# Patient Record
Sex: Female | Born: 1970
Health system: Southern US, Community
[De-identification: ages and names within clinical notes are randomized; demographics above are authoritative.]

## PROBLEM LIST (undated history)

## (undated) DIAGNOSIS — F419 Anxiety disorder, unspecified: Secondary | ICD-10-CM

## (undated) DIAGNOSIS — G4700 Insomnia, unspecified: Secondary | ICD-10-CM

## (undated) DIAGNOSIS — Z9289 Personal history of other medical treatment: Secondary | ICD-10-CM

## (undated) DIAGNOSIS — F32A Depression, unspecified: Secondary | ICD-10-CM

## (undated) DIAGNOSIS — F320 Major depressive disorder, single episode, mild: Secondary | ICD-10-CM

## (undated) DIAGNOSIS — F410 Panic disorder [episodic paroxysmal anxiety] without agoraphobia: Secondary | ICD-10-CM

## (undated) HISTORY — DX: Panic disorder (episodic paroxysmal anxiety): F41.0

## (undated) HISTORY — DX: Personal history of other medical treatment: Z92.89

## (undated) HISTORY — DX: Anxiety disorder, unspecified: F41.9

## (undated) HISTORY — DX: Major depressive disorder, single episode, mild: F32.0

## (undated) HISTORY — DX: Insomnia, unspecified: G47.00

## (undated) HISTORY — DX: Depression, unspecified: F32.A

---
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
## 1995-12-05 HISTORY — PX: WISDOM TOOTH EXTRACTION: SHX21

## 2001-12-04 HISTORY — PX: AUGMENTATION MAMMAPLASTY: SUR837

## 2005-09-14 ENCOUNTER — Encounter: Payer: Self-pay | Admitting: Otolaryngology

## 2005-10-04 ENCOUNTER — Encounter: Payer: Self-pay | Admitting: Otolaryngology

## 2011-12-05 HISTORY — PX: APPENDECTOMY: SHX54

## 2012-07-01 LAB — COMPREHENSIVE METABOLIC PANEL
Albumin: 3.6 g/dL (ref 3.4–5.0)
Alkaline Phosphatase: 54 U/L (ref 50–136)
Anion Gap: 10 (ref 7–16)
Calcium, Total: 8.3 mg/dL — ABNORMAL LOW (ref 8.5–10.1)
Co2: 23 mmol/L (ref 21–32)
Osmolality: 275 (ref 275–301)
Potassium: 3.5 mmol/L (ref 3.5–5.1)
SGOT(AST): 22 U/L (ref 15–37)
Sodium: 138 mmol/L (ref 136–145)

## 2012-07-01 LAB — URINALYSIS, COMPLETE
Bilirubin,UR: NEGATIVE
Blood: NEGATIVE
Ketone: NEGATIVE
Ph: 9 (ref 4.5–8.0)
RBC,UR: 9 /HPF (ref 0–5)
Squamous Epithelial: 5

## 2012-07-01 LAB — CBC
HCT: 36.4 % (ref 35.0–47.0)
MCHC: 35.4 g/dL (ref 32.0–36.0)
Platelet: 234 10*3/uL (ref 150–440)
RBC: 4.15 10*6/uL (ref 3.80–5.20)
RDW: 12.6 % (ref 11.5–14.5)

## 2012-07-01 LAB — PREGNANCY, URINE: Pregnancy Test, Urine: NEGATIVE m[IU]/mL

## 2012-07-01 LAB — LIPASE, BLOOD: Lipase: 114 U/L (ref 73–393)

## 2012-07-02 LAB — TROPONIN I: Troponin-I: <0.02 ng/mL

## 2012-07-03 ENCOUNTER — Inpatient Hospital Stay: Payer: Self-pay | Admitting: Surgery

## 2012-07-03 LAB — CBC WITH DIFFERENTIAL/PLATELET
Basophil #: 0.1 10*3/uL (ref 0.0–0.1)
Basophil %: 0.6 %
Eosinophil #: 0 10*3/uL (ref 0.0–0.7)
Eosinophil %: 0.3 %
HCT: 31.2 % — ABNORMAL LOW (ref 35.0–47.0)
HGB: 10.8 g/dL — ABNORMAL LOW (ref 12.0–16.0)
Lymphocyte %: 9.2 %
MCHC: 34.6 g/dL (ref 32.0–36.0)
Monocyte %: 6.7 %
Neutrophil #: 7.7 10*3/uL — ABNORMAL HIGH (ref 1.4–6.5)
Platelet: 172 10*3/uL (ref 150–440)
RBC: 3.49 10*6/uL — ABNORMAL LOW (ref 3.80–5.20)
WBC: 9.2 10*3/uL (ref 3.6–11.0)

## 2012-07-04 LAB — PATHOLOGY REPORT

## 2012-07-05 LAB — CBC WITH DIFFERENTIAL/PLATELET
Basophil %: 0.4 %
Eosinophil %: 2 %
HCT: 29.5 % — ABNORMAL LOW (ref 35.0–47.0)
HGB: 10.2 g/dL — ABNORMAL LOW (ref 12.0–16.0)
Lymphocyte #: 1.3 10*3/uL (ref 1.0–3.6)
Lymphocyte %: 22.2 %
MCHC: 34.7 g/dL (ref 32.0–36.0)
MCV: 89 fL (ref 80–100)
Monocyte %: 15.4 %
Neutrophil #: 3.6 10*3/uL (ref 1.4–6.5)
RBC: 3.3 10*6/uL — ABNORMAL LOW (ref 3.80–5.20)
RDW: 13 % (ref 11.5–14.5)
WBC: 5.9 10*3/uL (ref 3.6–11.0)

## 2013-01-06 DIAGNOSIS — Z9289 Personal history of other medical treatment: Secondary | ICD-10-CM

## 2013-01-06 HISTORY — DX: Personal history of other medical treatment: Z92.89

## 2015-02-01 DIAGNOSIS — Z9289 Personal history of other medical treatment: Secondary | ICD-10-CM

## 2015-02-01 HISTORY — DX: Personal history of other medical treatment: Z92.89

## 2015-03-23 NOTE — H&P (Signed)
Subjective/Chief Complaint severe abdominal pain    History of Present Illness 40 yowf with 2 days worsening abdominal pain nausea emesis, now with severe rlq abdominal pain. CT scan shows acute appendicitis    Past History none   Past Med/Surgical Hx:  Denies medical history:   Laparoscopy:   ALLERGIES:  No Known Allergies:     Other Allergies none   HOME MEDICATIONS: Medication Instructions Status  b   birth control pill daily Active   Family and Social History:   Family History Non-Contributory    Social History positive  tobacco, positive ETOH, negative Illicit drugs    + Tobacco Current (within 1 year)    Place of Living Home   Review of Systems:   Subjective/Chief Complaint see above    Abdominal Pain Yes    Nausea/Vomiting Yes    Tolerating Diet No  Nauseated  Vomiting    Medications/Allergies Reviewed Medications/Allergies reviewed   Physical Exam:   GEN thin, disheveled, critically ill appearing    HEENT pale conjunctivae    NECK supple    RESP normal resp effort  clear BS    CARD regular rate    ABD positive tenderness  positive Flank Tenderness  no hernia  distended    LYMPH negative neck    EXTR negative cyanosis/clubbing    SKIN normal to palpation    NEURO cranial nerves intact    PSYCH A+O to time, place, person   Lab Results: Hepatic:  29-Jul-13 21:01    Bilirubin, Total 0.7   Alkaline Phosphatase 54   SGPT (ALT) 34 (12-78 NOTE: NEW REFERENCE RANGE 10/27/2011)   SGOT (AST) 22   Total Protein, Serum 6.9   Albumin, Serum 3.6  Routine Chem:  29-Jul-13 21:01    Glucose, Serum  148   BUN  3   Creatinine (comp) 0.72   Sodium, Serum 138   Potassium, Serum 3.5   Chloride, Serum 105   CO2, Serum 23   Calcium (Total), Serum  8.3   Osmolality (calc) 275   eGFR (African American) >60   eGFR (Non-African American) >60 (eGFR values <47m/min/1.73 m2 may be an indication of chronic kidney disease (CKD). Calculated eGFR is  useful in patients with stable renal function. The eGFR calculation will not be reliable in acutely ill patients when serum creatinine is changing rapidly. It is not useful in  patients on dialysis. The eGFR calculation may not be applicable to patients at the low and high extremes of body sizes, pregnant women, and vegetarians.)   Anion Gap 10   Lipase 114 (Result(s) reported on 01 Jul 2012 at 09:38PM.)  Routine UA:  29-Jul-13 21:01    Color (UA) Yellow   Clarity (UA) Hazy   Glucose (UA) 50 mg/dL   Bilirubin (UA) Negative   Ketones (UA) Negative   Specific Gravity (UA) 1.011   Blood (UA) Negative   pH (UA) 9.0   Protein (UA) Negative   Nitrite (UA) Negative   Leukocyte Esterase (UA) Negative (Result(s) reported on 01 Jul 2012 at 09:40PM.)   RBC (UA) 9 /HPF   WBC (UA) 1 /HPF   Bacteria (UA) 1+   Epithelial Cells (UA) 5 /HPF   Mucous (UA) PRESENT (Result(s) reported on 01 Jul 2012 at 09:40PM.)  Routine Sero:  29-Jul-13 21:01    Pregnancy Test, Urine NEGATIVE (The results of the qualitative urine HCG (Pregnancy Test) should be evaluated in light of other clinical information.  There are limitations to the  test which, in certain clinical situations, may result in a false positive or negative result. Thehigh dose hook effect can occur in urine samples with extremely high HCG concentrations.  This effect can produce a negative result in certain situations. It is suggested that results of the qualitative HCG be confirmed by an alternate methodology, such as the quantitative serum beta HCG test.)  Routine Hem:  29-Jul-13 21:01    WBC (CBC) 8.4   RBC (CBC) 4.15   Hemoglobin (CBC) 12.9   Hematocrit (CBC) 36.4   Platelet Count (CBC) 234 (Result(s) reported on 01 Jul 2012 at 09:28PM.)   MCV 88   MCH 31.0   MCHC 35.4   RDW 12.6   Radiology Results: CT:    30-Jul-13 06:41, CT Abdomen and Pelvis With Contrast   CT Abdomen and Pelvis With Contrast   REASON FOR EXAM:    (1) 24  hrs of abd pain/pressure; (2) abd pain  COMMENTS:       PROCEDURE: CT  - CT ABDOMEN / PELVIS  W  - Jul 02 2012  6:41AM     RESULT: History: Pain.    Findings: Standard CT obtained the 100 cc of Isovue-300. Bilateral breast   implants noted. Lung bases clear. No free air. Liver normal. Fatty   infiltration adjacent to the a pulse for malignant noted. Gallbladder   nondistended. Pancreas normal. Spleen normal. Adrenals normal. No focal   renal abnormalities noted. Aorta nondistended. The appendix is dilated to   1.2 cm and contains an appendicolith. These finds are consistent with   appendicitis. Free fluid noted in the pelvis. Ruptured appendix cannot be   excluded.  IMPRESSION:  Findings consistent with appendicitiswith possible rupture.          Verified By: Osa Craver, M.D., MD     Assessment/Admission Diagnosis 68 yowf with acute appendicitis    Plan npo, abx, lap appendectomy, discussed procedure with her and family and all questions addressed. consent personally obtained. time spent 30 minutes   Electronic Signatures: Sherri Rad (MD)  (Signed 30-Jul-13 08:36)  Authored: CHIEF COMPLAINT and HISTORY, PAST MEDICAL/SURGIAL HISTORY, ALLERGIES, Other Allergies, HOME MEDICATIONS, FAMILY AND SOCIAL HISTORY, REVIEW OF SYSTEMS, PHYSICAL EXAM, LABS, Radiology, ASSESSMENT AND PLAN   Last Updated: 30-Jul-13 08:36 by Sherri Rad (MD)

## 2015-03-23 NOTE — Op Note (Signed)
PATIENT NAME:  Tammy Wilkins, Tammy Wilkins MR#:  [id number] DATE OF BIRTH:  10-18-1971  DATE OF PROCEDURE:  07/02/2012  PREOPERATIVE DIAGNOSIS: Acute appendicitis.   POSTOPERATIVE DIAGNOSIS: Acute gangrenous appendicitis.   PROCEDURE PERFORMED: Laparoscopic appendectomy.   SURGEON: Hortencia Conradi, MD   ASSISTANT: None.   ANESTHESIA: General endotracheal.   FINDINGS: Acute gangrenous appendicitis. Normal gallbladder and liver.   ESTIMATED BLOOD LOSS: 25 mL.   DRAINS: None.   COUNTS: LAP and needle counts correct x2.   DESCRIPTION OF PROCEDURE: With informed consent, supine position, general endotracheal anesthesia, the left arm padded and tucked at the side, the abdomen was sterilely prepped and draped with ChloraPrep solution. Timeout was observed. A 12 mm blunt Hassan trocar was placed through an open technique through an infraumbilical transverse skin incision. Pneumoperitoneum was established. A 12 mm Bladeless trocar was placed in the right upper quadrant, a 5 mm Bladeless trocar in the left lower quadrant. The appendix was identified, gangrenous, and photodocumentation was obtained. It was elevated towards the anterior abdominal wall. The mesoappendix was taken with two fires of the endoscopic 35 mm stapler with white load application. The base of the appendix was then clearly identified, and a single fire of the blue load of the stapler was utilized. The specimen was captured in an Endo Catch device and retrieved. The right lower quadrant was irrigated with a total of 2 liters of saline. During the process, a small amount of purulent material was spilled from inside the appendix, but no signs of diffuse peritonitis were identified at the time of laparoscopy.   Hemostasis was obtained with point cautery along the mesoappendix staple line, followed by application of Surgiflo with thrombin application. Ports were then removed under direct visualization. The right upper quadrant port site was closed  with a Endo Stitch of #0 Vicryl suture. The infraumbilical fascial defect was closed with an additional figure-of-eight #0 Vicryl suture and the existing stay sutures tied to each other. A total of 30 mL of 0.25% plain Marcaine was infiltrated along all skin and fascial incisions prior to closure. Then 4-0 Vicryl was used to reapproximate skin edges, followed by benzoin, Steri-Strips, Telfa, and Tegaderm. The patient was then subsequently extubated and taken to the recovery room in stable and satisfactory condition by Anesthesia services.    ____________________________ Jeannette How Marina Gravel, MD mab:cbb D: 07/02/2012 10:54:22 ET T: 07/02/2012 12:41:01 ET JOB#: [id number]  cc: Elta Guadeloupe A. Marina Gravel, MD, <Dictator> Rosendo Couser A Mikhi Athey MD ELECTRONICALLY SIGNED 07/09/2012 9:18

## 2015-03-23 NOTE — Discharge Summary (Signed)
PATIENT NAME:  Tammy Wilkins, Tammy Wilkins MR#:  [id number] DATE OF BIRTH:  September 05, 1971  DATE OF ADMISSION:  07/03/2012 DATE OF DISCHARGE:  07/08/2012  ATTENDING PHYSICIAN: Sherri Rad, MD   DISCHARGING PHYSICIAN: Tammy Norfolk. Aaryn Parrilla, MD   DISCHARGE DIAGNOSIS: Gangrenous appendicitis.   PROCEDURES PERFORMED: Laparoscopic appendectomy  DISCHARGE MEDICATIONS:  1. Birth control pill qdaily.  2. Clindamycin 300 mg p.o. t.i.d. x4 days. 3. Keflex 500 mg p.o. t.i.d. x4 days. 4. Tylox 500 mg, 1 to 2 tabs p.o. every 4 hours p.r.n.  pain.  INDICATION FOR ADMISSION/HOSPITAL COURSE:  [name] is a pleasant 44 year old female who was admitted for right lower quadrant pain.  Her workup showed that she had acute appendicitis.  She underwent laparoscopic appendectomy which proved to be gangrenous.  Postoperatively, she had pain control issues and required further intravenous antibiotics as there was a small amount of intraabdominal contamination.  Her diet was slowly advanced and she was converted to PO pain medications and antibiotics.  At the time of discharge she was having good p.o. pain control, was voiding and stooling without difficulty.  Pain was controlled, and she was on day 6 of 10 for antibiotics.    DISCHARGE INSTRUCTIONS:  1. The patient is to not lift greater than 10 pounds for approx 6 weeks postoperatively.  2. She is not to drive on pain medications.  3. She is to take p.o. pain medications as needed for pain. 4.  She is to call or return to clinic if she has increased pain, fevers greater than 101.5, nausea/vomiting, redness/drainage from incision. 5. She is to follow up with  in 7 to 10 days. 6. She is to be excused from work until followup.  ____________________________ Tammy Norfolk. Fayth Trefry, MD cal:cbb D: 07/08/2012 11:56:11 ET T: 07/08/2012 12:21:44 ET JOB#: [id number] cc: Harrell Gave A. Renea Schoonmaker, MD, <Dictator> Floyde Parkins MD ELECTRONICALLY SIGNED 07/08/2012 14:37

## 2017-07-09 ENCOUNTER — Encounter: Payer: Self-pay | Admitting: Obstetrics and Gynecology

## 2017-07-09 ENCOUNTER — Ambulatory Visit (INDEPENDENT_AMBULATORY_CARE_PROVIDER_SITE_OTHER): Payer: Managed Care, Other (non HMO) | Admitting: Obstetrics and Gynecology

## 2017-07-09 VITALS — BP 98/62 | HR 87 | Ht 62.0 in | Wt 139.0 lb

## 2017-07-09 DIAGNOSIS — T192XXA Foreign body in vulva and vagina, initial encounter: Secondary | ICD-10-CM | POA: Diagnosis not present

## 2017-07-09 NOTE — Progress Notes (Signed)
     Obstetrics & Gynecology Office Visit   Chief Complaint:  Chief Complaint  Patient presents with  . Gynecologic Exam    retained tampon x1 day    History of Present Illness: 45 year old female presenting for evaluation of possible retained tampon.  She reports LMP last week, recalls placing tampon yesterday but when she went to remove it she was unable to find it.  She has not noted any foul smelling vaginal discharge, fevers, or chills.  She has been unable to feel a tampon string or tampon material.  Does not have a prior history of problems with retained tampons.  Still having some light bleeding.     Review of Systems: 10 point review of systems negative unless otherwise noted in HPI  Past Medical History:  Past Medical History:  Diagnosis Date  . Anxiety     Past Surgical History:  Past Surgical History:  Procedure Laterality Date  . APPENDECTOMY  2013    Gynecologic History: Patient's last menstrual period was 07/04/2017 (exact date).  Obstetric History: [id number]  Family History:  History reviewed. No pertinent family history.  Social History:  Social History   Social History  . Marital status: Married    Spouse name: N/A  . Number of children: N/A  . Years of education: N/A   Occupational History  . Not on file.   Social History Main Topics  . Smoking status: Never Smoker  . Smokeless tobacco: Never Used  . Alcohol use Yes  . Drug use: No  . Sexual activity: Yes    Partners: Male    Birth control/ protection: None   Other Topics Concern  . Not on file   Social History Narrative  . No narrative on file    Allergies:  No Known Allergies  Medications: Prior to Admission medications   Medication Sig Start Date End Date Taking? Authorizing Provider  ALPRAZolam Duanne Moron) 0.25 MG tablet Take 0.25 mg by mouth at bedtime as needed for anxiety.   Yes Copland, Deirdre Evener, PA-C    Physical Exam Vitals:  Vitals:   07/09/17 0859  BP: 98/62  Pulse:  87   Patient's last menstrual period was 07/04/2017 (exact date).  General: NAD HEENT: normocephalic, anicteric Pulmonary: No increased work of breathing  Genitourinary:  External: Normal external female genitalia.  Normal urethral meatus, normal  Bartholin's and Skene's glands.    Vagina: Normal vaginal mucosa, no evidence of prolapse.    Cervix: Grossly normal in appearance, no bleeding  Uterus: Non-enlarged, mobile, normal contour.  No CMT  Adnexa: ovaries non-enlarged, no adnexal masses  Rectal: deferred  Lymphatic: no evidence of inguinal lymphadenopathy Extremities: no edema, erythema, or tenderness Neurologic: Grossly intact Psychiatric: mood appropriate, affect full  Female chaperone present for pelvic and breast  portions of the physical exam  Assessment: 46 y.o. [id number] presenting for evaluation of retained tampon, no evidence of retained tampon on exam  Plan: Problem List Items Addressed This Visit    None    Visit Diagnoses    Retained tampon, initial encounter    -  Primary     - No evidence of retained tampon - Discussed concerns with retained tampon and toxic shock syndrome - A total of 15 minutes were spent in face-to-face contact with the patient during this encounter with over half of that time devoted to counseling and coordination of care.

## 2017-08-29 ENCOUNTER — Ambulatory Visit (INDEPENDENT_AMBULATORY_CARE_PROVIDER_SITE_OTHER): Payer: Managed Care, Other (non HMO) | Admitting: Obstetrics and Gynecology

## 2017-08-29 ENCOUNTER — Encounter: Payer: Self-pay | Admitting: Obstetrics and Gynecology

## 2017-08-29 VITALS — BP 110/80 | HR 78 | Ht 62.0 in | Wt 141.0 lb

## 2017-08-29 DIAGNOSIS — Z1231 Encounter for screening mammogram for malignant neoplasm of breast: Secondary | ICD-10-CM | POA: Diagnosis not present

## 2017-08-29 DIAGNOSIS — Z01419 Encounter for gynecological examination (general) (routine) without abnormal findings: Secondary | ICD-10-CM | POA: Diagnosis not present

## 2017-08-29 DIAGNOSIS — Z30011 Encounter for initial prescription of contraceptive pills: Secondary | ICD-10-CM

## 2017-08-29 DIAGNOSIS — Z1239 Encounter for other screening for malignant neoplasm of breast: Secondary | ICD-10-CM

## 2017-08-29 DIAGNOSIS — F419 Anxiety disorder, unspecified: Secondary | ICD-10-CM

## 2017-08-29 MED ORDER — NORETHIN-ETH ESTRAD-FE BIPHAS 1 MG-10 MCG / 10 MCG PO TABS: 1.0000 | ORAL_TABLET | Freq: Every day | ORAL | 3 refills | Status: DC

## 2017-08-29 MED ORDER — ALPRAZOLAM 0.25 MG PO TABS: 0.2500 mg | ORAL_TABLET | Freq: Every evening | ORAL | 0 refills | Status: DC | PRN

## 2017-08-29 NOTE — Progress Notes (Signed)
PCP:  Patient, No Pcp Per   Chief Complaint  Patient presents with  . Gynecologic Exam    See blood work from work; restart bc     HPI:      [name] is a 46 y.o. [id number] who LMP was Patient's last menstrual period was 08/08/2017., presents today for her annual examination.  Her menses are regular every 28-30 days, lasting 7 days, med heavy flow.  Dysmenorrhea mild, occurring first 1-2 days of flow. She does not have intermenstrual bleeding. Pt interested in OCPs again for cycle improvement. No hx of migraines with aura/tobacco use/HTN. She did OCPs in the past without any side effects.  Sex activity: single partner, contraception - none. Conception ok but pt wants BC now for cycles. Last Pap: February 11, 2016  Results were: no abnormalities /neg HPV DNA  Hx of STDs: none  Last mammogram: February 11, 2016  Results were: normal--routine follow-up in 12 months There is no FH of breast cancer. There is no FH of ovarian cancer. The patient does do self-breast exams.  Tobacco use: The patient denies current or previous tobacco use. Alcohol use: social drinker No drug use.  Exercise: moderately active  She does get adequate calcium and Vitamin D in her diet.  She needs Rx RF xanax to take sparingly for anxiety.   Past Medical History:  Diagnosis Date  . Anxiety     Past Surgical History:  Procedure Laterality Date  . APPENDECTOMY  2013  . EYE SURGERY  1974  . WISDOM TOOTH EXTRACTION  1997   all four    Family History  Problem Relation Age of Onset  . Heart attack Father 7       passed away  . Diabetes Maternal Grandmother   . Cancer Maternal Grandfather 65       lung  . Diabetes Maternal Grandfather   . Diabetes Paternal Grandmother   . Diabetes Paternal Grandfather     Social History   Social History  . Marital status: Married    Spouse name: N/A  . Number of children: N/A  . Years of education: N/A   Occupational History  . Not on file.    Social History Main Topics  . Smoking status: Never Smoker  . Smokeless tobacco: Never Used  . Alcohol use Yes  . Drug use: No  . Sexual activity: Yes    Partners: Male    Birth control/ protection: None   Other Topics Concern  . Not on file   Social History Narrative  . No narrative on file    Current Meds  Medication Sig  . ALPRAZolam (XANAX) 0.25 MG tablet Take 1 tablet (0.25 mg total) by mouth at bedtime as needed for anxiety.  . [DISCONTINUED] ALPRAZolam (XANAX) 0.25 MG tablet Take 0.25 mg by mouth at bedtime as needed for anxiety.     ROS:  Review of Systems  Constitutional: Negative for fatigue, fever and unexpected weight change.  Respiratory: Negative for cough, shortness of breath and wheezing.   Cardiovascular: Negative for chest pain, palpitations and leg swelling.  Gastrointestinal: Negative for blood in stool, constipation, diarrhea, nausea and vomiting.  Endocrine: Negative for cold intolerance, heat intolerance and polyuria.  Genitourinary: Negative for dyspareunia, dysuria, flank pain, frequency, genital sores, hematuria, menstrual problem, pelvic pain, urgency, vaginal bleeding, vaginal discharge and vaginal pain.  Musculoskeletal: Negative for back pain, joint swelling and myalgias.  Skin: Negative for rash.  Neurological: Negative for dizziness, syncope,  light-headedness, numbness and headaches.  Hematological: Negative for adenopathy.  Psychiatric/Behavioral: Negative for agitation, confusion, sleep disturbance and suicidal ideas. The patient is not nervous/anxious.      Objective: BP 110/80   Pulse 78   Ht 5' 2"  (1.575 m)   Wt 141 lb (64 kg)   LMP 08/08/2017   BMI 25.79 kg/m    Physical Exam  Constitutional: She is oriented to person, place, and time. She appears well-developed and well-nourished.  Genitourinary: Vagina normal and uterus normal. There is no rash or tenderness on the right labia. There is no rash or tenderness on the left  labia. No erythema or tenderness in the vagina. No vaginal discharge found. Right adnexum does not display mass and does not display tenderness. Left adnexum does not display mass and does not display tenderness. Cervix does not exhibit motion tenderness or polyp. Uterus is not enlarged or tender.  Neck: Normal range of motion. No thyromegaly present.  Cardiovascular: Normal rate, regular rhythm and normal heart sounds.   No murmur heard. Pulmonary/Chest: Effort normal and breath sounds normal. Right breast exhibits no mass, no nipple discharge, no skin change and no tenderness. Left breast exhibits no mass, no nipple discharge, no skin change and no tenderness.  Abdominal: Soft. There is no tenderness. There is no guarding.  Musculoskeletal: Normal range of motion.  Neurological: She is alert and oriented to person, place, and time. No cranial nerve deficit.  Psychiatric: She has a normal mood and affect. Her behavior is normal.  Vitals reviewed.   Assessment/Plan: Encounter for annual routine gynecological examination  Screening for breast cancer - Pt to sched mammo. - Plan: MM DIGITAL SCREENING BILATERAL  Encounter for initial prescription of contraceptive pills - OCP start with next menses. Rx Lo Lo. 1 sample/coupon card. Condoms for 1 wk. - Plan: Norethindrone-Ethinyl Estradiol-Fe Biphas (LO LOESTRIN FE) 1 MG-10 MCG / 10 MCG tablet  Anxiety - Rx RF xanax to take sparingly - Plan: ALPRAZolam (XANAX) 0.25 MG tablet  Meds ordered this encounter  Medications  . Norethindrone-Ethinyl Estradiol-Fe Biphas (LO LOESTRIN FE) 1 MG-10 MCG / 10 MCG tablet    Sig: Take 1 tablet by mouth daily.    Dispense:  84 tablet    Refill:  3  . ALPRAZolam (XANAX) 0.25 MG tablet    Sig: Take 1 tablet (0.25 mg total) by mouth at bedtime as needed for anxiety.    Dispense:  30 tablet    Refill:  0             GYN counsel mammography screening, use and side effects of OCP's, adequate intake of calcium and  vitamin D, diet and exercise     F/U  Return in about 1 year (around 08/29/2018).  Alicia B. Copland, PA-C 08/29/2017 9:30 AM

## 2017-11-30 ENCOUNTER — Other Ambulatory Visit: Payer: Self-pay | Admitting: General Surgery

## 2017-11-30 ENCOUNTER — Other Ambulatory Visit: Payer: Self-pay | Admitting: Neurosurgery

## 2017-11-30 DIAGNOSIS — R110 Nausea: Secondary | ICD-10-CM

## 2017-11-30 DIAGNOSIS — R1011 Right upper quadrant pain: Secondary | ICD-10-CM

## 2017-12-03 ENCOUNTER — Other Ambulatory Visit: Payer: Self-pay | Admitting: General Surgery

## 2017-12-03 ENCOUNTER — Ambulatory Visit
Admission: RE | Admit: 2017-12-03 | Discharge: 2017-12-03 | Disposition: A | Discharge status: Home / Self Care | Payer: Managed Care, Other (non HMO) | Source: Ambulatory Visit | Attending: General Surgery | Admitting: General Surgery

## 2017-12-03 DIAGNOSIS — R1011 Right upper quadrant pain: Secondary | ICD-10-CM | POA: Diagnosis not present

## 2017-12-03 DIAGNOSIS — R110 Nausea: Secondary | ICD-10-CM | POA: Diagnosis present

## 2017-12-17 ENCOUNTER — Other Ambulatory Visit: Payer: Self-pay | Admitting: Obstetrics and Gynecology

## 2017-12-17 DIAGNOSIS — F419 Anxiety disorder, unspecified: Secondary | ICD-10-CM

## 2018-01-01 ENCOUNTER — Encounter: Payer: Self-pay | Admitting: Internal Medicine

## 2018-01-01 ENCOUNTER — Ambulatory Visit: Payer: Managed Care, Other (non HMO) | Admitting: Internal Medicine

## 2018-01-01 VITALS — BP 120/71 | HR 79 | Resp 16 | Ht 62.0 in | Wt 140.6 lb

## 2018-01-01 DIAGNOSIS — K819 Cholecystitis, unspecified: Secondary | ICD-10-CM | POA: Insufficient documentation

## 2018-01-01 DIAGNOSIS — F419 Anxiety disorder, unspecified: Secondary | ICD-10-CM | POA: Diagnosis not present

## 2018-01-01 DIAGNOSIS — Z1231 Encounter for screening mammogram for malignant neoplasm of breast: Secondary | ICD-10-CM

## 2018-01-01 DIAGNOSIS — R109 Unspecified abdominal pain: Secondary | ICD-10-CM

## 2018-01-01 NOTE — Progress Notes (Signed)
[facility] [place], [place] [address]  Internal MEDICINE  Office Visit Note  Patient Name: Tammy Wilkins  [id number]  [id number]  Date of Service: 01/01/2018   Complaints/HPI Pt is here for establishment of PCP. Abdominal pain and bloating Went to walk in and had u/s done Wbc was elevated. Took 2 different antibiotics Still gets her cycle. She is pain free after antibiotics    Current Medication: Outpatient Encounter Medications as of 01/01/2018  Medication Sig  . ALPRAZolam (XANAX) 0.25 MG tablet TAKE 1 TABLET BY MOUTH EVERY NIGHT AT BEDTIME AS NEEDED FOR ANXIETY  . Norethindrone-Ethinyl Estradiol-Fe Biphas (LO LOESTRIN FE) 1 MG-10 MCG / 10 MCG tablet Take 1 tablet by mouth daily.   No facility-administered encounter medications on file as of 01/01/2018.     Surgical History: Past Surgical History:  Procedure Laterality Date  . APPENDECTOMY  2013  . EYE SURGERY  1974  . WISDOM TOOTH EXTRACTION  1997   all four    Medical History: Past Medical History:  Diagnosis Date  . Anxiety     Family History: Family History  Problem Relation Age of Onset  . Heart attack Father 2       passed away  . Diabetes Maternal Grandmother   . Cancer Maternal Grandfather 65       lung  . Diabetes Maternal Grandfather   . Diabetes Paternal Grandmother   . Diabetes Paternal Grandfather     Social History   Socioeconomic History  . Marital status: Married    Spouse name: Not on file  . Number of children: Not on file  . Years of education: Not on file  . Highest education level: Not on file  Social Needs  . Financial resource strain: Not on file  . Food insecurity - worry: Not on file  . Food insecurity - inability: Not on file  . Transportation needs - medical: Not on file  . Transportation needs - non-medical: Not on file  Occupational History  . Not on file  Tobacco Use  . Smoking status: Never Smoker  . Smokeless tobacco: Never Used   Substance and Sexual Activity  . Alcohol use: Yes  . Drug use: No  . Sexual activity: Yes    Partners: Male    Birth control/protection: None  Other Topics Concern  . Not on file  Social History Narrative  . Not on file     Review of Systems  Constitutional: Negative for chills, fatigue and unexpected weight change.  HENT: Positive for postnasal drip. Negative for congestion, rhinorrhea, sneezing and sore throat.   Eyes: Negative for redness.  Respiratory: Negative for cough, chest tightness and shortness of breath.   Cardiovascular: Negative for chest pain and palpitations.  Gastrointestinal: Negative for abdominal pain, constipation, diarrhea, nausea and vomiting.  Genitourinary: Negative for dysuria and frequency.  Musculoskeletal: Negative for arthralgias, back pain, joint swelling and neck pain.  Skin: Negative for rash.  Neurological: Negative.  Negative for tremors and numbness.  Hematological: Negative for adenopathy. Does not bruise/bleed easily.  Psychiatric/Behavioral: Negative for behavioral problems (Depression), sleep disturbance and suicidal ideas. The patient is not nervous/anxious.     Vital Signs: BP 120/71 (BP Location: Left Arm, Patient Position: Sitting)   Pulse 79   Resp 16   Ht 5' 2"  (1.575 m)   Wt 140 lb 9.6 oz (63.8 kg)   SpO2 99%   BMI 25.72 kg/m    Physical Exam  Constitutional: She  is oriented to person, place, and time. She appears well-developed and well-nourished. No distress.  HENT:  Head: Normocephalic and atraumatic.  Mouth/Throat: Oropharynx is clear and moist. No oropharyngeal exudate.  Eyes: EOM are normal. Pupils are equal, round, and reactive to light.  Neck: Normal range of motion. Neck supple. No JVD present. No tracheal deviation present. No thyromegaly present.  Cardiovascular: Normal rate, regular rhythm and normal heart sounds. Exam reveals no gallop and no friction rub.  No murmur heard. Pulmonary/Chest: Effort normal. No  respiratory distress. She has no wheezes. She has no rales. She exhibits no tenderness.  Abdominal: Soft. Bowel sounds are normal.  Musculoskeletal: Normal range of motion.  Lymphadenopathy:    She has no cervical adenopathy.  Neurological: She is alert and oriented to person, place, and time. No cranial nerve deficit.  Skin: Skin is warm and dry. She is not diaphoretic.  Psychiatric: She has a normal mood and affect. Her behavior is normal. Judgment and thought content normal.    Assessment/Plan: 1. Abdominal pain, unspecified abdominal location - CBC With Differential - Sed Rate (ESR)  2. Anxiety - Controlled.  3. Breast cancer screening by mammogram - MM DIGITAL SCREENING BILATERAL; Future  General Counseling: Tammy Wilkins understanding of the findings of todays visit and agrees with plan of treatment. I have discussed any further diagnostic evaluation that may be needed or ordered today. We also reviewed her medications today. she has been encouraged to call the office with any questions or concerns that should arise related to todays visit.    Time spent: 30 Minutes

## 2018-01-24 ENCOUNTER — Other Ambulatory Visit: Payer: Self-pay | Admitting: Internal Medicine

## 2018-01-24 ENCOUNTER — Ambulatory Visit
Admission: RE | Admit: 2018-01-24 | Discharge: 2018-01-24 | Disposition: A | Discharge status: Home / Self Care | Payer: Managed Care, Other (non HMO) | Source: Ambulatory Visit | Attending: Internal Medicine | Admitting: Internal Medicine

## 2018-01-24 DIAGNOSIS — Z1231 Encounter for screening mammogram for malignant neoplasm of breast: Secondary | ICD-10-CM | POA: Insufficient documentation

## 2018-01-25 LAB — CBC WITH DIFFERENTIAL
Basophils Absolute: 0 10*3/uL (ref 0.0–0.2)
Basos: 0 %
EOS (ABSOLUTE): 0 10*3/uL (ref 0.0–0.4)
EOS: 1 %
HEMATOCRIT: 38.2 % (ref 34.0–46.6)
HEMOGLOBIN: 13 g/dL (ref 11.1–15.9)
IMMATURE GRANULOCYTES: 1 %
Immature Grans (Abs): 0.1 10*3/uL (ref 0.0–0.1)
LYMPHS ABS: 1.5 10*3/uL (ref 0.7–3.1)
Lymphs: 28 %
MCH: 29.6 pg (ref 26.6–33.0)
MCHC: 34 g/dL (ref 31.5–35.7)
MCV: 87 fL (ref 79–97)
MONOS ABS: 0.7 10*3/uL (ref 0.1–0.9)
Monocytes: 13 %
Neutrophils Absolute: 3 10*3/uL (ref 1.4–7.0)
Neutrophils: 57 %
RBC: 4.39 x10E6/uL (ref 3.77–5.28)
RDW: 13.9 % (ref 12.3–15.4)
WBC: 5.3 10*3/uL (ref 3.4–10.8)

## 2018-01-25 LAB — SEDIMENTATION RATE: Sed Rate: 5 mm/hr (ref 0–32)

## 2018-03-26 ENCOUNTER — Encounter: Payer: Self-pay | Admitting: Obstetrics and Gynecology

## 2018-03-26 ENCOUNTER — Other Ambulatory Visit: Payer: Self-pay | Admitting: Obstetrics and Gynecology

## 2018-03-26 MED ORDER — NORETHINDRONE ACET-ETHINYL EST 1-20 MG-MCG PO TABS: 1.0000 | ORAL_TABLET | Freq: Every day | ORAL | 1 refills | Status: DC

## 2018-03-26 NOTE — Progress Notes (Signed)
Rx change due to ins and now needs mail order.

## 2018-07-17 ENCOUNTER — Other Ambulatory Visit: Payer: Self-pay | Admitting: Obstetrics and Gynecology

## 2018-07-17 DIAGNOSIS — F419 Anxiety disorder, unspecified: Secondary | ICD-10-CM

## 2018-07-17 NOTE — Telephone Encounter (Signed)
Please advise. Thank you

## 2018-09-04 ENCOUNTER — Encounter: Payer: Self-pay | Admitting: Obstetrics and Gynecology

## 2018-09-04 ENCOUNTER — Ambulatory Visit (INDEPENDENT_AMBULATORY_CARE_PROVIDER_SITE_OTHER): Payer: PRIVATE HEALTH INSURANCE | Admitting: Obstetrics and Gynecology

## 2018-09-04 VITALS — BP 100/60 | HR 78 | Ht 62.0 in | Wt 146.0 lb

## 2018-09-04 DIAGNOSIS — F419 Anxiety disorder, unspecified: Secondary | ICD-10-CM

## 2018-09-04 DIAGNOSIS — R61 Generalized hyperhidrosis: Secondary | ICD-10-CM

## 2018-09-04 DIAGNOSIS — Z01411 Encounter for gynecological examination (general) (routine) with abnormal findings: Secondary | ICD-10-CM

## 2018-09-04 DIAGNOSIS — Z01419 Encounter for gynecological examination (general) (routine) without abnormal findings: Secondary | ICD-10-CM

## 2018-09-04 DIAGNOSIS — Z3041 Encounter for surveillance of contraceptive pills: Secondary | ICD-10-CM | POA: Diagnosis not present

## 2018-09-04 DIAGNOSIS — R635 Abnormal weight gain: Secondary | ICD-10-CM

## 2018-09-04 DIAGNOSIS — Z1239 Encounter for other screening for malignant neoplasm of breast: Secondary | ICD-10-CM | POA: Diagnosis not present

## 2018-09-04 MED ORDER — NORETHINDRONE ACET-ETHINYL EST 1-20 MG-MCG PO TABS
1.0000 | ORAL_TABLET | Freq: Every day | ORAL | 3 refills | Status: DC
Start: 2018-09-04 — End: 2019-09-10

## 2018-09-04 NOTE — Progress Notes (Signed)
PCP:  Lavera Guise, MD   Chief Complaint  Patient presents with  . Gynecologic Exam    pt thinks she is premonopausal, sweating middle night, has gained weight this year as well, and last period 5/3     HPI:      [name] is a 47 y.o. [id number] who LMP was Patient's last menstrual period was 04/05/2018 (exact date)., presents today for her annual examination.  Her menses were regular every 28-30 days, lasting 4 days, light to med flow. No menses since 5/19, however, on OCPs.  Dysmenorrhea mild, occurring first 1-2 days of flow. She does not have intermenstrual bleeding. Started OCPs last yr for heavier flow with good sx relief.  Wants to continue. Having night sweats and wt gain (5# in our office from last yr) this yr. Hasn't had recent thyroid check. Exercises regularly. Normal CBC 2/19.  Sex activity: single partner, contraception - OCPs Last Pap: February 11, 2016  Results were: no abnormalities /neg HPV DNA  Hx of STDs: none  Last mammogram: 01/24/18  Results were: normal--routine follow-up in 12 months There is no FH of breast cancer. There is no FH of ovarian cancer. The patient does do self-breast exams.  Tobacco use: The patient denies current or previous tobacco use. Alcohol use: social drinker No drug use.  Exercise: moderately active  She does get adequate calcium and Vitamin D in her diet.  She takes xanax sparingly for anxiety. Doesn't need RF today.  Past Medical History:  Diagnosis Date  . Anxiety   . History of mammogram 02/01/2015   birad 1  . History of Papanicolaou smear of cervix 01/06/2013   neg/neg  . Insomnia   . Mild depression ([place])   . Panic attacks     Past Surgical History:  Procedure Laterality Date  . APPENDECTOMY  2013  . AUGMENTATION MAMMAPLASTY Bilateral 2003  . EYE SURGERY  1974  . WISDOM TOOTH EXTRACTION  1997   all four    Family History  Problem Relation Age of Onset  . Heart attack Father 56       passed away  .  Diabetes Father   . Lung disease Father        TB  . Diabetes Maternal Grandmother   . Cancer Maternal Grandfather 65       lung  . Diabetes Maternal Grandfather   . Diabetes Paternal Grandmother   . Diabetes Paternal Grandfather   . Breast cancer Neg Hx     Social History   Socioeconomic History  . Marital status: Married    Spouse name: Not on file  . Number of children: Not on file  . Years of education: Not on file  . Highest education level: Not on file  Occupational History  . Not on file  Social Needs  . Financial resource strain: Not on file  . Food insecurity:    Worry: Not on file    Inability: Not on file  . Transportation needs:    Medical: Not on file    Non-medical: Not on file  Tobacco Use  . Smoking status: Never Smoker  . Smokeless tobacco: Never Used  Substance and Sexual Activity  . Alcohol use: Yes    Comment: socially  . Drug use: No  . Sexual activity: Yes    Partners: Male    Birth control/protection: Pill  Lifestyle  . Physical activity:    Days per week: Not on file  Minutes per session: Not on file  . Stress: Not on file  Relationships  . Social connections:    Talks on phone: Not on file    Gets together: Not on file    Attends religious service: Not on file    Active member of club or organization: Not on file    Attends meetings of clubs or organizations: Not on file    Relationship status: Not on file  . Intimate partner violence:    Fear of current or ex partner: Not on file    Emotionally abused: Not on file    Physically abused: Not on file    Forced sexual activity: Not on file  Other Topics Concern  . Not on file  Social History Narrative  . Not on file    Current Meds  Medication Sig  . ALPRAZolam (XANAX) 0.25 MG tablet TAKE 1 TABLET BY MOUTH EVERY NIGHT AT BEDTIME AS NEEDED FOR ANXIETY  . norethindrone-ethinyl estradiol (MICROGESTIN) 1-20 MG-MCG tablet Take 1 tablet by mouth daily.  . [DISCONTINUED]  norethindrone-ethinyl estradiol (MICROGESTIN) 1-20 MG-MCG tablet Take 1 tablet by mouth daily.     ROS:  Review of Systems  Constitutional: Positive for unexpected weight change. Negative for fatigue and fever.  Respiratory: Negative for cough, shortness of breath and wheezing.   Cardiovascular: Negative for chest pain, palpitations and leg swelling.  Gastrointestinal: Negative for blood in stool, constipation, diarrhea, nausea and vomiting.  Endocrine: Negative for cold intolerance, heat intolerance and polyuria.  Genitourinary: Negative for dyspareunia, dysuria, flank pain, frequency, genital sores, hematuria, menstrual problem, pelvic pain, urgency, vaginal bleeding, vaginal discharge and vaginal pain.  Musculoskeletal: Negative for back pain, joint swelling and myalgias.  Skin: Negative for rash.  Neurological: Negative for dizziness, syncope, light-headedness, numbness and headaches.  Hematological: Negative for adenopathy.  Psychiatric/Behavioral: Negative for agitation, confusion, sleep disturbance and suicidal ideas. The patient is not nervous/anxious.      Objective: BP 100/60   Pulse 78   Ht 5' 2"  (1.575 m)   Wt 146 lb (66.2 kg)   LMP 04/05/2018 (Exact Date)   BMI 26.70 kg/m    Physical Exam  Constitutional: She is oriented to person, place, and time. She appears well-developed and well-nourished.  Genitourinary: Vagina normal and uterus normal. There is no rash or tenderness on the right labia. There is no rash or tenderness on the left labia. No erythema or tenderness in the vagina. No vaginal discharge found. Right adnexum does not display mass and does not display tenderness. Left adnexum does not display mass and does not display tenderness. Cervix does not exhibit motion tenderness or polyp. Uterus is not enlarged or tender.  Neck: Normal range of motion. No thyromegaly present.  Cardiovascular: Normal rate, regular rhythm and normal heart sounds.  No murmur  heard. Pulmonary/Chest: Effort normal and breath sounds normal. Right breast exhibits no mass, no nipple discharge, no skin change and no tenderness. Left breast exhibits no mass, no nipple discharge, no skin change and no tenderness.  Abdominal: Soft. There is no tenderness. There is no guarding.  Musculoskeletal: Normal range of motion.  Neurological: She is alert and oriented to person, place, and time. No cranial nerve deficit.  Psychiatric: She has a normal mood and affect. Her behavior is normal.  Vitals reviewed.   Assessment/Plan: Encounter for annual routine gynecological examination  Screening for breast cancer - Pt to sched mammo for 2/20 - Plan: MM 3D SCREEN BREAST BILATERAL  Encounter for surveillance of  contraceptive pills - OCP RF.  - Plan: norethindrone-ethinyl estradiol (MICROGESTIN) 1-20 MG-MCG tablet  Anxiety - Will call for xanax RF prn  Night sweats - Check labs. WIll call wiht results. If neg, most likely perimenopause.  - Plan: TSH + free T4, Comprehensive metabolic panel  Weight gain - Plan: TSH + free T4, Comprehensive metabolic panel  Meds ordered this encounter  Medications  . norethindrone-ethinyl estradiol (MICROGESTIN) 1-20 MG-MCG tablet    Sig: Take 1 tablet by mouth daily.    Dispense:  84 tablet    Refill:  3    Order Specific Question:   Supervising Provider    Answer:   Gae Dry [[id number]]             GYN counsel mammography screening, use and side effects of OCP's, adequate intake of calcium and vitamin D, diet and exercise     F/U  Return in about 1 year (around 09/05/2019).  Alicia B. Copland, PA-C 09/04/2018 3:38 PM

## 2018-09-04 NOTE — Patient Instructions (Signed)
I value your feedback and entrusting [place] with your care. If you get a [place] patient survey, I would appreciate you taking the time to let us know about your experience today. Thank you!  [facility] at [facility]: [phone]

## 2018-09-05 LAB — COMPREHENSIVE METABOLIC PANEL
ALT: 18 IU/L (ref 0–32)
AST: 20 IU/L (ref 0–40)
Albumin/Globulin Ratio: 1.6 (ref 1.2–2.2)
Albumin: 4.4 g/dL (ref 3.5–5.5)
Alkaline Phosphatase: 50 IU/L (ref 39–117)
BUN/Creatinine Ratio: 12 (ref 9–23)
BUN: 10 mg/dL (ref 6–24)
Bilirubin Total: 0.2 mg/dL (ref 0.0–1.2)
CALCIUM: 9.2 mg/dL (ref 8.7–10.2)
CO2: 23 mmol/L (ref 20–29)
CREATININE: 0.84 mg/dL (ref 0.57–1.00)
Chloride: 104 mmol/L (ref 96–106)
GFR calc Af Amer: 97 mL/min/{1.73_m2} (ref >59)
GFR, EST NON AFRICAN AMERICAN: 84 mL/min/{1.73_m2} (ref >59)
GLUCOSE: 96 mg/dL (ref 65–99)
Globulin, Total: 2.8 g/dL (ref 1.5–4.5)
Potassium: 4.3 mmol/L (ref 3.5–5.2)
Sodium: 141 mmol/L (ref 134–144)
Total Protein: 7.2 g/dL (ref 6.0–8.5)

## 2018-09-05 LAB — TSH+FREE T4
FREE T4: 1.15 ng/dL (ref 0.82–1.77)
TSH: 1.86 u[IU]/mL (ref 0.450–4.500)

## 2018-10-25 ENCOUNTER — Other Ambulatory Visit: Payer: Self-pay | Admitting: Obstetrics and Gynecology

## 2018-10-25 DIAGNOSIS — F419 Anxiety disorder, unspecified: Secondary | ICD-10-CM

## 2018-10-25 MED ORDER — ALPRAZOLAM 0.25 MG PO TABS: 0.2500 mg | ORAL_TABLET | Freq: Every evening | ORAL | 0 refills | Status: DC | PRN

## 2018-10-25 NOTE — Telephone Encounter (Signed)
Pt's last appointment for annual was 10/2. No future appointments scheduled at this time

## 2018-12-31 ENCOUNTER — Ambulatory Visit: Payer: Managed Care, Other (non HMO) | Admitting: Adult Health

## 2019-04-30 ENCOUNTER — Other Ambulatory Visit: Payer: Self-pay | Admitting: Obstetrics and Gynecology

## 2019-04-30 DIAGNOSIS — F419 Anxiety disorder, unspecified: Secondary | ICD-10-CM

## 2019-04-30 NOTE — Telephone Encounter (Signed)
Please advise 

## 2019-09-10 ENCOUNTER — Other Ambulatory Visit: Payer: Self-pay

## 2019-09-10 ENCOUNTER — Ambulatory Visit (INDEPENDENT_AMBULATORY_CARE_PROVIDER_SITE_OTHER): Payer: BC Managed Care – PPO | Admitting: Obstetrics and Gynecology

## 2019-09-10 ENCOUNTER — Encounter: Payer: Self-pay | Admitting: Obstetrics and Gynecology

## 2019-09-10 VITALS — BP 100/70 | Ht 62.0 in | Wt 140.0 lb

## 2019-09-10 DIAGNOSIS — Z01419 Encounter for gynecological examination (general) (routine) without abnormal findings: Secondary | ICD-10-CM

## 2019-09-10 DIAGNOSIS — F419 Anxiety disorder, unspecified: Secondary | ICD-10-CM

## 2019-09-10 DIAGNOSIS — Z1231 Encounter for screening mammogram for malignant neoplasm of breast: Secondary | ICD-10-CM

## 2019-09-10 DIAGNOSIS — N951 Menopausal and female climacteric states: Secondary | ICD-10-CM

## 2019-09-10 DIAGNOSIS — Z3041 Encounter for surveillance of contraceptive pills: Secondary | ICD-10-CM

## 2019-09-10 MED ORDER — NORETHINDRONE ACET-ETHINYL EST 1-20 MG-MCG PO TABS: 1.0000 | ORAL_TABLET | Freq: Every day | ORAL | 3 refills | Status: DC

## 2019-09-10 MED ORDER — ALPRAZOLAM 0.25 MG PO TABS: ORAL_TABLET | ORAL | 0 refills | Status: DC

## 2019-09-10 NOTE — Patient Instructions (Signed)
I value your feedback and entrusting [place] with your care. If you get a [place] patient survey, I would appreciate you taking the time to let us know about your experience today. Thank you!  [place] at [facility]: [phone]

## 2019-09-10 NOTE — Progress Notes (Signed)
PCP:  Lavera Guise, MD   Chief Complaint  Patient presents with  . Gynecologic Exam     HPI:      [name] is a 48 y.o. [phone] who LMP was No LMP recorded (lmp unknown). (Menstrual status: Oral contraceptives)., presents today for her annual examination.  Her menses are infrequent on OCPs now, lasting 1-7 days, light to med flow. Dysmenorrhea mild. She does not have intermenstrual bleeding. Started OCPsin past for heavier flow with good sx relief.  FH earlier menopause in her mom and sisters.   Sex activity: single partner, contraception - OCPs Last Pap: February 11, 2016  Results were: no abnormalities /neg HPV DNA  Hx of STDs: none  Last mammogram: 01/24/18  Results were: normal--routine follow-up in 12 months There is no FH of breast cancer. There is no FH of ovarian cancer. The patient does do self-breast exams.  Tobacco use: The patient denies current or previous tobacco use. Alcohol use: social drinker No drug use.  Exercise: moderately active  She does get adequate calcium and Vitamin D in her diet.  She takes xanax sparingly for anxiety. Needs RF  Past Medical History:  Diagnosis Date  . Anxiety   . History of mammogram 02/01/2015   birad 1  . History of Papanicolaou smear of cervix 01/06/2013   neg/neg  . Insomnia   . Mild depression ([place])   . Panic attacks     Past Surgical History:  Procedure Laterality Date  . APPENDECTOMY  2013  . AUGMENTATION MAMMAPLASTY Bilateral 2003  . EYE SURGERY  1974  . WISDOM TOOTH EXTRACTION  1997   all four    Family History  Problem Relation Age of Onset  . Heart attack Father 21       passed away  . Diabetes Father   . Lung disease Father        TB  . Diabetes Maternal Grandmother   . Cancer Maternal Grandfather 65       lung  . Diabetes Maternal Grandfather   . Diabetes Paternal Grandmother   . Diabetes Paternal Grandfather   . Breast cancer Neg Hx     Social History   Socioeconomic History   . Marital status: Married    Spouse name: Not on file  . Number of children: Not on file  . Years of education: Not on file  . Highest education level: Not on file  Occupational History  . Not on file  Social Needs  . Financial resource strain: Not on file  . Food insecurity    Worry: Not on file    Inability: Not on file  . Transportation needs    Medical: Not on file    Non-medical: Not on file  Tobacco Use  . Smoking status: Never Smoker  . Smokeless tobacco: Never Used  Substance and Sexual Activity  . Alcohol use: Yes    Comment: socially  . Drug use: No  . Sexual activity: Yes    Partners: Male    Birth control/protection: Pill  Lifestyle  . Physical activity    Days per week: Not on file    Minutes per session: Not on file  . Stress: Not on file  Relationships  . Social Herbalist on phone: Not on file    Gets together: Not on file    Attends religious service: Not on file    Active member of club or organization: Not on file  Attends meetings of clubs or organizations: Not on file    Relationship status: Not on file  . Intimate partner violence    Fear of current or ex partner: Not on file    Emotionally abused: Not on file    Physically abused: Not on file    Forced sexual activity: Not on file  Other Topics Concern  . Not on file  Social History Narrative  . Not on file    Current Meds  Medication Sig  . ALPRAZolam (XANAX) 0.25 MG tablet TAKE 1 TABLET(0.25 MG) BY MOUTH AT BEDTIME AS NEEDED FOR ANXIETY  . norethindrone-ethinyl estradiol (MICROGESTIN) 1-20 MG-MCG tablet Take 1 tablet by mouth daily.  . [DISCONTINUED] ALPRAZolam (XANAX) 0.25 MG tablet TAKE 1 TABLET(0.25 MG) BY MOUTH AT BEDTIME AS NEEDED FOR ANXIETY  . [DISCONTINUED] norethindrone-ethinyl estradiol (MICROGESTIN) 1-20 MG-MCG tablet Take 1 tablet by mouth daily.     ROS:  Review of Systems  Constitutional: Negative for fatigue, fever and unexpected weight change.   Respiratory: Negative for cough, shortness of breath and wheezing.   Cardiovascular: Negative for chest pain, palpitations and leg swelling.  Gastrointestinal: Negative for blood in stool, constipation, diarrhea, nausea and vomiting.  Endocrine: Negative for cold intolerance, heat intolerance and polyuria.  Genitourinary: Negative for dyspareunia, dysuria, flank pain, frequency, genital sores, hematuria, menstrual problem, pelvic pain, urgency, vaginal bleeding, vaginal discharge and vaginal pain.  Musculoskeletal: Negative for back pain, joint swelling and myalgias.  Skin: Negative for rash.  Neurological: Negative for dizziness, syncope, light-headedness, numbness and headaches.  Hematological: Negative for adenopathy.  Psychiatric/Behavioral: Negative for agitation, confusion, sleep disturbance and suicidal ideas. The patient is not nervous/anxious.      Objective: BP 100/70   Ht 5' 2"  (1.575 m)   Wt 140 lb (63.5 kg)   LMP  (LMP Unknown)   BMI 25.61 kg/m    Physical Exam Constitutional:      Appearance: She is well-developed.  Genitourinary:     Vulva, vagina, uterus, right adnexa and left adnexa normal.     No vulval lesion or tenderness noted.     No vaginal discharge, erythema or tenderness.     No cervical motion tenderness or polyp.     Uterus is not enlarged or tender.     No right or left adnexal mass present.     Right adnexa not tender.     Left adnexa not tender.  Neck:     Musculoskeletal: Normal range of motion.     Thyroid: No thyromegaly.  Cardiovascular:     Rate and Rhythm: Normal rate and regular rhythm.     Heart sounds: Normal heart sounds. No murmur.  Pulmonary:     Effort: Pulmonary effort is normal.     Breath sounds: Normal breath sounds.  Chest:     Breasts:        Right: No mass, nipple discharge, skin change or tenderness.        Left: No mass, nipple discharge, skin change or tenderness.  Abdominal:     Palpations: Abdomen is soft.      Tenderness: There is no abdominal tenderness. There is no guarding.  Musculoskeletal: Normal range of motion.  Neurological:     General: No focal deficit present.     Mental Status: She is alert and oriented to person, place, and time.     Cranial Nerves: No cranial nerve deficit.  Skin:    General: Skin is warm and dry.  Psychiatric:  Mood and Affect: Mood normal.        Behavior: Behavior normal.        Thought Content: Thought content normal.        Judgment: Judgment normal.  Vitals signs reviewed.     Assessment/Plan: Encounter for annual routine gynecological examination  Encounter for screening mammogram for malignant neoplasm of breast - Plan: MM 3D SCREEN BREAST BILATERAL; pt to sched mammo  Encounter for surveillance of contraceptive pills - Plan: norethindrone-ethinyl estradiol (MICROGESTIN) 1-20 MG-MCG tablet; OCP RF. Re-eval next yr given infrequent bleeding. FH early menopause.  Perimenopause--Question infrequent periods due to OCPs vs perimenopause. Will re-eval next yr. F/u prn.  Anxiety - Plan: ALPRAZolam (XANAX) 0.25 MG tablet; Rx RF. Pt takes sparingly prn sx.     Meds ordered this encounter  Medications  . norethindrone-ethinyl estradiol (MICROGESTIN) 1-20 MG-MCG tablet    Sig: Take 1 tablet by mouth daily.    Dispense:  84 tablet    Refill:  3    Order Specific Question:   Supervising Provider    Answer:   Gae Dry [id number]  . ALPRAZolam (XANAX) 0.25 MG tablet    Sig: TAKE 1 TABLET(0.25 MG) BY MOUTH AT BEDTIME AS NEEDED FOR ANXIETY    Dispense:  30 tablet    Refill:  0    Order Specific Question:   Supervising Provider    Answer:   Gae Dry [[id number]]             GYN counsel mammography screening, use and side effects of OCP's, adequate intake of calcium and vitamin D, diet and exercise     F/U  Return in about 1 year (around 09/09/2020).  Tammy Munter B. Delitha Elms, PA-C 09/10/2019 2:37 PM

## 2020-01-06 ENCOUNTER — Other Ambulatory Visit: Payer: Self-pay | Admitting: Obstetrics and Gynecology

## 2020-01-06 DIAGNOSIS — F419 Anxiety disorder, unspecified: Secondary | ICD-10-CM

## 2020-01-06 NOTE — Telephone Encounter (Signed)
Please advise 

## 2020-04-08 ENCOUNTER — Other Ambulatory Visit: Payer: Self-pay | Admitting: Obstetrics and Gynecology

## 2020-04-08 DIAGNOSIS — F419 Anxiety disorder, unspecified: Secondary | ICD-10-CM

## 2020-09-06 ENCOUNTER — Other Ambulatory Visit: Payer: Self-pay | Admitting: Obstetrics and Gynecology

## 2020-09-06 DIAGNOSIS — Z1231 Encounter for screening mammogram for malignant neoplasm of breast: Secondary | ICD-10-CM

## 2020-09-20 ENCOUNTER — Other Ambulatory Visit: Payer: Self-pay | Admitting: Obstetrics and Gynecology

## 2020-09-20 DIAGNOSIS — Z3041 Encounter for surveillance of contraceptive pills: Secondary | ICD-10-CM

## 2020-09-20 DIAGNOSIS — F419 Anxiety disorder, unspecified: Secondary | ICD-10-CM

## 2020-09-20 NOTE — Telephone Encounter (Signed)
Annual 11/16

## 2020-10-18 NOTE — Patient Instructions (Addendum)
I value your feedback and entrusting [place] with your care. If you get a [place] patient survey, I would appreciate you taking the time to let us know about your experience today. Thank you!  As of November 13, 2019, your lab results will be released to your MyChart immediately, before I even have a chance to see them. Please give me time to review them and contact you if there are any abnormalities. Thank you for your patience.   [place] at [facility]: [phone]

## 2020-10-18 NOTE — Progress Notes (Signed)
PCP:  Lavera Guise, MD   Chief Complaint  Patient presents with  . Gynecologic Exam     HPI:      [name] is a 49 y.o. [id number] who LMP was Patient's last menstrual period was 09/20/2020 (approximate)., presents today for her annual examination.  Her menses are usually absent on OCPs, but had 7 days bleeding this past month, light to med flow. Dysmenorrhea mild. She does not have intermenstrual bleeding. Started OCPs in past for heavier flow with good sx relief.  FH earlier menopause in her mom and sisters. Occas vasomotor sx.  Sex activity: single partner, contraception - OCPs Last Pap: February 11, 2016  Results were: no abnormalities /neg HPV DNA  Hx of STDs: none  Last mammogram: 01/24/18  Results were: normal--routine follow-up in 12 months There is no FH of breast cancer. There is no FH of ovarian cancer. The patient does do self-breast exams.  Tobacco use: The patient denies current or previous tobacco use. Alcohol use: social drinker No drug use.  Exercise: moderately active  Colonoscopy: never  She does get adequate calcium and Vitamin D in her diet.  She takes xanax sparingly for anxiety. Needs RF  Past Medical History:  Diagnosis Date  . Anxiety   . History of mammogram 02/01/2015   birad 1  . History of Papanicolaou smear of cervix 01/06/2013   neg/neg  . Insomnia   . Mild depression ([place])   . Panic attacks     Past Surgical History:  Procedure Laterality Date  . APPENDECTOMY  2013  . AUGMENTATION MAMMAPLASTY Bilateral 2003  . EYE SURGERY  1974  . WISDOM TOOTH EXTRACTION  1997   all four    Family History  Problem Relation Age of Onset  . Heart attack Father 9       passed away  . Diabetes Father   . Lung disease Father        TB  . Diabetes Maternal Grandmother   . Cancer Maternal Grandfather 65       lung  . Diabetes Maternal Grandfather   . Diabetes Paternal Grandmother   . Diabetes Paternal Grandfather   . Breast cancer  Neg Hx     Social History   Socioeconomic History  . Marital status: Married    Spouse name: Not on file  . Number of children: Not on file  . Years of education: Not on file  . Highest education level: Not on file  Occupational History  . Not on file  Tobacco Use  . Smoking status: Never Smoker  . Smokeless tobacco: Never Used  Vaping Use  . Vaping Use: Never used  Substance and Sexual Activity  . Alcohol use: Yes    Comment: socially  . Drug use: No  . Sexual activity: Yes    Partners: Male    Birth control/protection: Pill  Other Topics Concern  . Not on file  Social History Narrative  . Not on file   Social Determinants of Health   Financial Resource Strain:   . Difficulty of Paying Living Expenses: Not on file  Food Insecurity:   . Worried About Charity fundraiser in the Last Year: Not on file  . Ran Out of Food in the Last Year: Not on file  Transportation Needs:   . Lack of Transportation (Medical): Not on file  . Lack of Transportation (Non-Medical): Not on file  Physical Activity:   . Days of Exercise  per Week: Not on file  . Minutes of Exercise per Session: Not on file  Stress:   . Feeling of Stress : Not on file  Social Connections:   . Frequency of Communication with Friends and Family: Not on file  . Frequency of Social Gatherings with Friends and Family: Not on file  . Attends Religious Services: Not on file  . Active Member of Clubs or Organizations: Not on file  . Attends Archivist Meetings: Not on file  . Marital Status: Not on file  Intimate Partner Violence:   . Fear of Current or Ex-Partner: Not on file  . Emotionally Abused: Not on file  . Physically Abused: Not on file  . Sexually Abused: Not on file    Current Meds  Medication Sig  . ALPRAZolam (XANAX) 0.25 MG tablet Take 1 tablet (0.25 mg total) by mouth 2 (two) times daily as needed for anxiety.  . norethindrone-ethinyl estradiol (LOESTRIN) 1-20 MG-MCG tablet Take 1  tablet by mouth daily.  . [DISCONTINUED] ALPRAZolam (XANAX) 0.25 MG tablet TAKE 1 TABLET(0.25 MG) BY MOUTH AT BEDTIME AS NEEDED FOR ANXIETY  . [DISCONTINUED] norethindrone-ethinyl estradiol (LOESTRIN) 1-20 MG-MCG tablet TAKE 1 TABLET BY MOUTH EVERY DAY     ROS:  Review of Systems  Constitutional: Negative for fatigue, fever and unexpected weight change.  Respiratory: Negative for cough, shortness of breath and wheezing.   Cardiovascular: Negative for chest pain, palpitations and leg swelling.  Gastrointestinal: Negative for blood in stool, constipation, diarrhea, nausea and vomiting.  Endocrine: Negative for cold intolerance, heat intolerance and polyuria.  Genitourinary: Negative for dyspareunia, dysuria, flank pain, frequency, genital sores, hematuria, menstrual problem, pelvic pain, urgency, vaginal bleeding, vaginal discharge and vaginal pain.  Musculoskeletal: Negative for back pain, joint swelling and myalgias.  Skin: Negative for rash.  Neurological: Negative for dizziness, syncope, light-headedness, numbness and headaches.  Hematological: Negative for adenopathy.  Psychiatric/Behavioral: Positive for agitation. Negative for confusion, sleep disturbance and suicidal ideas. The patient is not nervous/anxious.      Objective: BP 100/70   Ht 5' 2"  (1.575 m)   Wt 137 lb (62.1 kg)   LMP 09/20/2020 (Approximate)   BMI 25.06 kg/m    Physical Exam Constitutional:      Appearance: She is well-developed.  Genitourinary:     Vulva, vagina, uterus, right adnexa and left adnexa normal.     No vulval lesion or tenderness noted.     No vaginal discharge, erythema or tenderness.     No cervical motion tenderness or polyp.     Uterus is not enlarged or tender.     No right or left adnexal mass present.     Right adnexa not tender.     Left adnexa not tender.  Neck:     Thyroid: No thyromegaly.  Cardiovascular:     Rate and Rhythm: Normal rate and regular rhythm.     Heart  sounds: Normal heart sounds. No murmur heard.   Pulmonary:     Effort: Pulmonary effort is normal.     Breath sounds: Normal breath sounds.  Chest:     Breasts:        Right: No mass, nipple discharge, skin change or tenderness.        Left: No mass, nipple discharge, skin change or tenderness.  Abdominal:     Palpations: Abdomen is soft.     Tenderness: There is no abdominal tenderness. There is no guarding.  Musculoskeletal:  General: Normal range of motion.     Cervical back: Normal range of motion.  Neurological:     General: No focal deficit present.     Mental Status: She is alert and oriented to person, place, and time.     Cranial Nerves: No cranial nerve deficit.  Skin:    General: Skin is warm and dry.  Psychiatric:        Mood and Affect: Mood normal.        Behavior: Behavior normal.        Thought Content: Thought content normal.        Judgment: Judgment normal.  Vitals reviewed.     Assessment/Plan: Encounter for annual routine gynecological examination  Cervical cancer screening - Plan: Cytology - PAP  Screening for HPV (human papillomavirus) - Plan: Cytology - PAP  Encounter for surveillance of contraceptive pills - Plan: norethindrone-ethinyl estradiol (LOESTRIN) 1-20 MG-MCG tablet; OCP RF. Will re-eval next yr.   Encounter for screening mammogram for malignant neoplasm of breast - Plan: MM 3D SCREEN BREAST BILATERAL; pt to sched mammo  Screening for colon cancer - Plan: Cologuard; colonoscopy/cologuard discussed. Pt elects cologuard. Ref sent. Will f/u with results.  Anxiety - Plan: ALPRAZolam (XANAX) 0.25 MG tablet; Rx RF. Pt takes sparingly. F/u prn.   Meds ordered this encounter  Medications  . ALPRAZolam (XANAX) 0.25 MG tablet    Sig: Take 1 tablet (0.25 mg total) by mouth 2 (two) times daily as needed for anxiety.    Dispense:  30 tablet    Refill:  0    Order Specific Question:   Supervising Provider    Answer:   Gae Dry  [id number]  . norethindrone-ethinyl estradiol (LOESTRIN) 1-20 MG-MCG tablet    Sig: Take 1 tablet by mouth daily.    Dispense:  84 tablet    Refill:  3    Order Specific Question:   Supervising Provider    Answer:   Gae Dry [[id number]]             GYN counsel mammography screening, use and side effects of OCP's, adequate intake of calcium and vitamin D, diet and exercise     F/U  Return in about 1 year (around 10/19/2021).  Sabree Nuon B. Rolf Fells, PA-C 10/19/2020 4:56 PM

## 2020-10-19 ENCOUNTER — Ambulatory Visit (INDEPENDENT_AMBULATORY_CARE_PROVIDER_SITE_OTHER): Payer: BC Managed Care – PPO | Admitting: Obstetrics and Gynecology

## 2020-10-19 ENCOUNTER — Other Ambulatory Visit (HOSPITAL_COMMUNITY)
Admission: RE | Admit: 2020-10-19 | Discharge: 2020-10-19 | Disposition: A | Discharge status: Home / Self Care | Payer: BC Managed Care – PPO | Source: Ambulatory Visit | Attending: Obstetrics and Gynecology | Admitting: Obstetrics and Gynecology

## 2020-10-19 ENCOUNTER — Other Ambulatory Visit: Payer: Self-pay

## 2020-10-19 ENCOUNTER — Encounter: Payer: Self-pay | Admitting: Obstetrics and Gynecology

## 2020-10-19 VITALS — BP 100/70 | Ht 62.0 in | Wt 137.0 lb

## 2020-10-19 DIAGNOSIS — Z3041 Encounter for surveillance of contraceptive pills: Secondary | ICD-10-CM | POA: Diagnosis not present

## 2020-10-19 DIAGNOSIS — Z124 Encounter for screening for malignant neoplasm of cervix: Secondary | ICD-10-CM | POA: Diagnosis not present

## 2020-10-19 DIAGNOSIS — F419 Anxiety disorder, unspecified: Secondary | ICD-10-CM

## 2020-10-19 DIAGNOSIS — Z1231 Encounter for screening mammogram for malignant neoplasm of breast: Secondary | ICD-10-CM

## 2020-10-19 DIAGNOSIS — Z01419 Encounter for gynecological examination (general) (routine) without abnormal findings: Secondary | ICD-10-CM

## 2020-10-19 DIAGNOSIS — Z1151 Encounter for screening for human papillomavirus (HPV): Secondary | ICD-10-CM

## 2020-10-19 DIAGNOSIS — Z1211 Encounter for screening for malignant neoplasm of colon: Secondary | ICD-10-CM

## 2020-10-19 MED ORDER — ALPRAZOLAM 0.25 MG PO TABS: 0.2500 mg | ORAL_TABLET | Freq: Two times a day (BID) | ORAL | 0 refills | Status: DC | PRN

## 2020-10-19 MED ORDER — NORETHINDRONE ACET-ETHINYL EST 1-20 MG-MCG PO TABS: 1.0000 | ORAL_TABLET | Freq: Every day | ORAL | 3 refills | Status: DC

## 2020-10-22 ENCOUNTER — Telehealth: Payer: Self-pay

## 2020-10-22 LAB — CYTOLOGY - PAP
Comment: NEGATIVE
Diagnosis: NEGATIVE
High risk HPV: NEGATIVE

## 2020-11-03 DIAGNOSIS — Z1211 Encounter for screening for malignant neoplasm of colon: Secondary | ICD-10-CM

## 2020-11-03 HISTORY — DX: Encounter for screening for malignant neoplasm of colon: Z12.11

## 2020-11-08 ENCOUNTER — Encounter: Payer: Self-pay | Admitting: Obstetrics and Gynecology

## 2020-11-08 LAB — COLOGUARD: Cologuard: NEGATIVE

## 2020-11-16 ENCOUNTER — Telehealth: Payer: Self-pay

## 2020-11-16 NOTE — Telephone Encounter (Signed)
Pt aware of NEG test results.

## 2021-01-14 ENCOUNTER — Other Ambulatory Visit: Payer: Self-pay | Admitting: Obstetrics and Gynecology

## 2021-01-14 DIAGNOSIS — F419 Anxiety disorder, unspecified: Secondary | ICD-10-CM

## 2021-05-17 ENCOUNTER — Other Ambulatory Visit: Payer: Self-pay | Admitting: Obstetrics and Gynecology

## 2021-05-17 DIAGNOSIS — F419 Anxiety disorder, unspecified: Secondary | ICD-10-CM

## 2021-05-17 MED ORDER — ALPRAZOLAM 0.25 MG PO TABS: 0.2500 mg | ORAL_TABLET | Freq: Two times a day (BID) | ORAL | 0 refills | Status: DC | PRN

## 2021-09-23 ENCOUNTER — Other Ambulatory Visit: Payer: Self-pay | Admitting: Obstetrics and Gynecology

## 2021-09-23 DIAGNOSIS — Z1231 Encounter for screening mammogram for malignant neoplasm of breast: Secondary | ICD-10-CM

## 2021-10-13 ENCOUNTER — Other Ambulatory Visit: Payer: Self-pay

## 2021-10-13 ENCOUNTER — Ambulatory Visit
Admission: RE | Admit: 2021-10-13 | Discharge: 2021-10-13 | Disposition: A | Discharge status: Home / Self Care | Payer: BC Managed Care – PPO | Source: Ambulatory Visit | Attending: Obstetrics and Gynecology | Admitting: Obstetrics and Gynecology

## 2021-10-13 DIAGNOSIS — Z1231 Encounter for screening mammogram for malignant neoplasm of breast: Secondary | ICD-10-CM | POA: Diagnosis present

## 2021-10-31 ENCOUNTER — Encounter: Payer: Self-pay | Admitting: Obstetrics and Gynecology

## 2021-10-31 NOTE — Progress Notes (Signed)
PCP:  Lavera Guise, MD   Chief Complaint  Patient presents with   Gynecologic Exam    No concerns     HPI:      [name] is a 50 y.o. [id number] who LMP was No LMP recorded. (Menstrual status: Oral contraceptives)., presents today for her annual examination.  Her menses are infrequent with OCPs, lasting a few days, light to med flow. No dysmen, no BTB. Started OCPs in past for heavier flow with good sx relief.  Occas vasomotor sx.  Sex activity: single partner, contraception - OCPs. No pain/bleeding Last Pap: 10/19/20  Results were: no abnormalities /neg HPV DNA  Hx of STDs: none  Last mammogram: 10/13/21  Results were: normal--routine follow-up in 12 months There is no FH of breast cancer. There is no FH of ovarian cancer. The patient does do self-breast exams.  Tobacco use: The patient denies current or previous tobacco use. Alcohol use: social drinker No drug use.  Exercise: moderately active  Colonoscopy: never; neg Cologuard 12/21, repeat after 3 yrs  She does get adequate calcium and Vitamin D in her diet.  She takes xanax sparingly for anxiety. Needs RF  Past Medical History:  Diagnosis Date   Anxiety    History of mammogram 02/01/2015   birad 1   History of Papanicolaou smear of cervix 01/06/2013   neg/neg   Insomnia    Mild depression    Panic attacks    Screening for colon cancer 11/2020   NEG Cologuard; repeat after 3 yrs    Past Surgical History:  Procedure Laterality Date   APPENDECTOMY  2013   AUGMENTATION MAMMAPLASTY Bilateral 2003   [place]   WISDOM TOOTH EXTRACTION  1997   all four    Family History  Problem Relation Age of Onset   Heart attack Father 30       passed away   Diabetes Father    Lung disease Father        TB   Diabetes Maternal Grandmother    Cancer Maternal Grandfather 81       lung   Diabetes Maternal Grandfather    Diabetes Paternal Grandmother    Diabetes Paternal Grandfather    Breast  cancer Neg Hx     Social History   Socioeconomic History   Marital status: Married    Spouse name: Not on file   Number of children: Not on file   Years of education: Not on file   Highest education level: Not on file  Occupational History   Not on file  Tobacco Use   Smoking status: Never   Smokeless tobacco: Never  Vaping Use   Vaping Use: Never used  Substance and Sexual Activity   Alcohol use: Yes    Comment: socially   Drug use: No   Sexual activity: Yes    Partners: Male    Birth control/protection: Pill  Other Topics Concern   Not on file  Social History Narrative   Not on file   Social Determinants of Health   Financial Resource Strain: Not on file  Food Insecurity: Not on file  Transportation Needs: Not on file  Physical Activity: Not on file  Stress: Not on file  Social Connections: Not on file  Intimate Partner Violence: Not on file    Current Meds  Medication Sig   [DISCONTINUED] ALPRAZolam (XANAX) 0.25 MG tablet Take 1 tablet (0.25 mg total) by mouth 2 (two) times daily  as needed for anxiety.   [DISCONTINUED] norethindrone-ethinyl estradiol (LOESTRIN) 1-20 MG-MCG tablet Take 1 tablet by mouth daily.     ROS:  Review of Systems  Constitutional:  Negative for fatigue, fever and unexpected weight change.  Respiratory:  Negative for cough, shortness of breath and wheezing.   Cardiovascular:  Negative for chest pain, palpitations and leg swelling.  Gastrointestinal:  Negative for blood in stool, constipation, diarrhea, nausea and vomiting.  Endocrine: Negative for cold intolerance, heat intolerance and polyuria.  Genitourinary:  Negative for dyspareunia, dysuria, flank pain, frequency, genital sores, hematuria, menstrual problem, pelvic pain, urgency, vaginal bleeding, vaginal discharge and vaginal pain.  Musculoskeletal:  Negative for back pain, joint swelling and myalgias.  Skin:  Negative for rash.  Neurological:  Negative for dizziness, syncope,  light-headedness, numbness and headaches.  Hematological:  Negative for adenopathy.  Psychiatric/Behavioral:  Positive for agitation. Negative for confusion, sleep disturbance and suicidal ideas. The patient is not nervous/anxious.     Objective: BP 100/80   Ht 5' 2"  (1.575 m)   Wt 144 lb (65.3 kg)   BMI 26.34 kg/m    Physical Exam Constitutional:      Appearance: She is well-developed.  Genitourinary:     Vulva normal.     Right Labia: No rash, tenderness or lesions.    Left Labia: No tenderness, lesions or rash.    No vaginal discharge, erythema or tenderness.      Right Adnexa: not tender and no mass present.    Left Adnexa: not tender and no mass present.    No cervical motion tenderness, friability or polyp.     Uterus is not enlarged or tender.  Breasts:    Right: No mass, nipple discharge, skin change or tenderness.     Left: No mass, nipple discharge, skin change or tenderness.  Neck:     Thyroid: No thyromegaly.  Cardiovascular:     Rate and Rhythm: Normal rate and regular rhythm.     Heart sounds: Normal heart sounds. No murmur heard. Pulmonary:     Effort: Pulmonary effort is normal.     Breath sounds: Normal breath sounds.  Abdominal:     Palpations: Abdomen is soft.     Tenderness: There is no abdominal tenderness. There is no guarding or rebound.  Musculoskeletal:        General: Normal range of motion.     Cervical back: Normal range of motion.  Lymphadenopathy:     Cervical: No cervical adenopathy.  Neurological:     General: No focal deficit present.     Mental Status: She is alert and oriented to person, place, and time.     Cranial Nerves: No cranial nerve deficit.  Skin:    General: Skin is warm and dry.  Psychiatric:        Mood and Affect: Mood normal.        Behavior: Behavior normal.        Thought Content: Thought content normal.        Judgment: Judgment normal.  Vitals reviewed.    Assessment/Plan: Encounter for annual routine  gynecological examination  Encounter for surveillance of contraceptive pills - Plan: norethindrone-ethinyl estradiol (LOESTRIN) 1-20 MG-MCG tablet; OCP RF. Will re-eval next yr.  Encounter for screening mammogram for malignant neoplasm of breast; pt current on mammo  Anxiety - Plan: ALPRAZolam (XANAX) 0.25 MG tablet; Rx RF. F/u prn.    Meds ordered this encounter  Medications   norethindrone-ethinyl estradiol (LOESTRIN) 1-20 MG-MCG  tablet    Sig: Take 1 tablet by mouth daily.    Dispense:  84 tablet    Refill:  3    Order Specific Question:   Supervising Provider    Answer:   Gae Dry [[id number]]   ALPRAZolam (XANAX) 0.25 MG tablet    Sig: Take 1 tablet (0.25 mg total) by mouth 2 (two) times daily as needed for anxiety.    Dispense:  30 tablet    Refill:  0    Order Specific Question:   Supervising Provider    Answer:   Gae Dry [[id number]]              GYN counsel mammography screening, use and side effects of OCP's, adequate intake of calcium and vitamin D, diet and exercise     F/U  Return in about 1 year (around 11/01/2022).  Carlous Olivares B. Doneshia Hill, PA-C 11/01/2021 10:55 AM

## 2021-11-01 ENCOUNTER — Other Ambulatory Visit: Payer: Self-pay

## 2021-11-01 ENCOUNTER — Encounter: Payer: Self-pay | Admitting: Obstetrics and Gynecology

## 2021-11-01 ENCOUNTER — Ambulatory Visit (INDEPENDENT_AMBULATORY_CARE_PROVIDER_SITE_OTHER): Payer: BC Managed Care – PPO | Admitting: Obstetrics and Gynecology

## 2021-11-01 VITALS — BP 100/80 | Ht 62.0 in | Wt 144.0 lb

## 2021-11-01 DIAGNOSIS — Z1231 Encounter for screening mammogram for malignant neoplasm of breast: Secondary | ICD-10-CM

## 2021-11-01 DIAGNOSIS — Z01419 Encounter for gynecological examination (general) (routine) without abnormal findings: Secondary | ICD-10-CM

## 2021-11-01 DIAGNOSIS — Z3041 Encounter for surveillance of contraceptive pills: Secondary | ICD-10-CM

## 2021-11-01 DIAGNOSIS — F419 Anxiety disorder, unspecified: Secondary | ICD-10-CM

## 2021-11-01 MED ORDER — NORETHINDRONE ACET-ETHINYL EST 1-20 MG-MCG PO TABS: 1.0000 | ORAL_TABLET | Freq: Every day | ORAL | 3 refills | Status: DC

## 2021-11-01 MED ORDER — ALPRAZOLAM 0.25 MG PO TABS: 0.2500 mg | ORAL_TABLET | Freq: Two times a day (BID) | ORAL | 0 refills | Status: DC | PRN

## 2021-11-01 NOTE — Patient Instructions (Signed)
I value your feedback and you entrusting [place] with your care. If you get a [facility] patient survey, I would appreciate you taking the time to let us know about your experience today. Thank you!

## 2022-10-16 ENCOUNTER — Other Ambulatory Visit: Payer: Self-pay | Admitting: Obstetrics and Gynecology

## 2022-10-16 DIAGNOSIS — Z1231 Encounter for screening mammogram for malignant neoplasm of breast: Secondary | ICD-10-CM

## 2022-10-19 ENCOUNTER — Ambulatory Visit: Payer: BC Managed Care – PPO | Admitting: Nurse Practitioner

## 2022-10-19 ENCOUNTER — Encounter: Payer: Self-pay | Admitting: Nurse Practitioner

## 2022-10-19 VITALS — BP 109/65 | HR 91 | Temp 97.8°F | Resp 16 | Ht 62.0 in | Wt 127.8 lb

## 2022-10-19 DIAGNOSIS — Z1211 Encounter for screening for malignant neoplasm of colon: Secondary | ICD-10-CM

## 2022-10-19 DIAGNOSIS — Z8249 Family history of ischemic heart disease and other diseases of the circulatory system: Secondary | ICD-10-CM | POA: Diagnosis not present

## 2022-10-19 DIAGNOSIS — D229 Melanocytic nevi, unspecified: Secondary | ICD-10-CM | POA: Diagnosis not present

## 2022-10-19 DIAGNOSIS — Z0000 Encounter for general adult medical examination without abnormal findings: Secondary | ICD-10-CM

## 2022-10-19 DIAGNOSIS — Z1212 Encounter for screening for malignant neoplasm of rectum: Secondary | ICD-10-CM

## 2022-10-19 DIAGNOSIS — E782 Mixed hyperlipidemia: Secondary | ICD-10-CM | POA: Diagnosis not present

## 2022-10-19 DIAGNOSIS — E559 Vitamin D deficiency, unspecified: Secondary | ICD-10-CM

## 2022-10-19 DIAGNOSIS — E538 Deficiency of other specified B group vitamins: Secondary | ICD-10-CM | POA: Diagnosis not present

## 2022-10-19 NOTE — Progress Notes (Signed)
[facility] [place], [place] [address]  Internal MEDICINE  Office Visit Note  Patient Name: Tammy Wilkins  [id number]  [id number]  Date of Service: 10/19/2022   Complaints/HPI Pt is here for establishment of PCP. Chief Complaint  Patient presents with   New Patient (Initial Visit)   HPI Tammy Wilkins presents for a new patient visit to establish care.  Well-appearing 51 year old female with no major medical problems  --works for [employer] system and lives at home with husband.  --diet is ok, tries to exercise with walking.  --nonsmoker, denies alcohol or illicit drug use.  New or worsening pain: none Other concerns: both parents have had silent heart attacks, mother had triple bypass surgery.  EKG done today, result is NSR.     Current Medication: Outpatient Encounter Medications as of 10/19/2022  Medication Sig   ALPRAZolam (XANAX) 0.25 MG tablet Take 1 tablet (0.25 mg total) by mouth 2 (two) times daily as needed for anxiety.   norethindrone-ethinyl estradiol (LOESTRIN) 1-20 MG-MCG tablet Take 1 tablet by mouth daily.   No facility-administered encounter medications on file as of 10/19/2022.    Surgical History: Past Surgical History:  Procedure Laterality Date   APPENDECTOMY  2013   AUGMENTATION MAMMAPLASTY Bilateral 2003   EYE SURGERY  1974   WISDOM TOOTH EXTRACTION  1997   all four    Medical History: Past Medical History:  Diagnosis Date   Anxiety    History of mammogram 02/01/2015   birad 1   History of Papanicolaou smear of cervix 01/06/2013   neg/neg   Insomnia    Mild depression    Panic attacks    Screening for colon cancer 11/2020   NEG Cologuard; repeat after 3 yrs    Family History: Family History  Problem Relation Age of Onset   Heart attack Father 101       passed away   Diabetes Father    Lung disease Father        TB   Diabetes Maternal Grandmother    Cancer Maternal Grandfather 67       lung    Diabetes Maternal Grandfather    Diabetes Paternal Grandmother    Diabetes Paternal Grandfather    Breast cancer Neg Hx     Social History   Socioeconomic History   Marital status: Married    Spouse name: Not on file   Number of children: Not on file   Years of education: Not on file   Highest education level: Not on file  Occupational History   Not on file  Tobacco Use   Smoking status: Never   Smokeless tobacco: Never  Vaping Use   Vaping Use: Never used  Substance and Sexual Activity   Alcohol use: Yes    Comment: socially   Drug use: No   Sexual activity: Yes    Partners: Male    Birth control/protection: Pill  Other Topics Concern   Not on file  Social History Narrative   Not on file   Social Determinants of Health   Financial Resource Strain: Not on file  Food Insecurity: Not on file  Transportation Needs: Not on file  Physical Activity: Not on file  Stress: Not on file  Social Connections: Not on file  Intimate Partner Violence: Not on file     Review of Systems  Constitutional:  Negative for chills, fatigue and unexpected weight change.  HENT: Negative.  Negative for congestion, postnasal drip,  rhinorrhea, sneezing and sore throat.   Eyes:  Negative for redness.  Respiratory: Negative.  Negative for cough, chest tightness and shortness of breath.   Cardiovascular:  Negative for chest pain and palpitations.  Gastrointestinal:  Negative for abdominal pain, constipation, diarrhea, nausea and vomiting.  Genitourinary:  Negative for dysuria and frequency.  Musculoskeletal:  Negative for arthralgias, back pain, joint swelling and neck pain.  Skin:  Negative for rash.  Neurological: Negative.  Negative for tremors and numbness.  Hematological:  Negative for adenopathy. Does not bruise/bleed easily.  Psychiatric/Behavioral:  Negative for behavioral problems (Depression), sleep disturbance and suicidal ideas. The patient is not nervous/anxious.     Vital  Signs: BP 109/65   Pulse 91   Temp 97.8 F (36.6 C)   Resp 16   Ht 5' 2" (1.575 m)   Wt 127 lb 12.8 oz (58 kg)   SpO2 99%   BMI 23.37 kg/m    Physical Exam Vitals reviewed.  Constitutional:      General: She is not in acute distress.    Appearance: Normal appearance. She is normal weight. She is not ill-appearing.  HENT:     Head: Normocephalic and atraumatic.  Eyes:     Pupils: Pupils are equal, round, and reactive to light.  Cardiovascular:     Rate and Rhythm: Normal rate and regular rhythm.  Pulmonary:     Effort: Pulmonary effort is normal. No respiratory distress.  Neurological:     Mental Status: She is alert and oriented to person, place, and time.  Psychiatric:        Mood and Affect: Mood normal.        Behavior: Behavior normal.       Assessment/Plan: 1. Multiple atypical skin moles Referred to dermatology for evaluation of moles and TBSE - Ambulatory referral to Dermatology  2. B12 deficiency Routine labs ordered.  - CBC with Differential/Platelet - CMP14+EGFR - Lipid Profile - TSH + free T4 - Iron, TIBC and Ferritin Panel  3. Mixed hyperlipidemia Routine labs ordered - CMP14+EGFR - Lipid Profile  4. Vitamin D deficiency Routine lab ordered - Vitamin D (25 hydroxy)  5. Family history of MI (myocardial infarction) EKG and vital signs are normal - EKG 12-Lead  6. Screening for colorectal cancer Due for routine colonoscopy, referred to GI - Ambulatory referral to Gastroenterology  7. Encounter for medical examination to establish care Establish care with PCP for routine medical care and preventive screenings.  Labs ordered, EKG done, results was normal NSR - CBC with Differential/Platelet - CMP14+EGFR - Lipid Profile - TSH + free T4 - B12 and Folate Panel - Vitamin D (25 hydroxy) - Iron, TIBC and Ferritin Panel    General Counseling: Tammy Wilkins verbalizes understanding of the findings of todays visit and agrees with plan of  treatment. I have discussed any further diagnostic evaluation that may be needed or ordered today. We also reviewed her medications today. she has been encouraged to call the office with any questions or concerns that should arise related to todays visit.    Counseling:  [place] Controlled Substance Database was reviewed by me.  Orders Placed This Encounter  Procedures   CBC with Differential/Platelet   CMP14+EGFR   Lipid Profile   TSH + free T4   B12 and Folate Panel   Vitamin D (25 hydroxy)   Iron, TIBC and Ferritin Panel   Ambulatory referral to Dermatology   Ambulatory referral to Gastroenterology   EKG 12-Lead  No orders of the defined types were placed in this encounter.   Return for CPE, Tammy PCP earliest opening available. in 1-2 months .  Time spent:30 Minutes Time spent with patient included reviewing progress notes, labs, imaging studies, and discussing plan for follow up.   [place] Controlled Substance Database was reviewed by me for overdose risk score (ORS)   This patient was seen by Jonetta Osgood, FNP-C in collaboration with Dr. Clayborn Bigness as a part of collaborative care agreement.    Tammy R. Valetta Fuller, MSN, FNP-C Internal Medicine

## 2022-10-23 ENCOUNTER — Other Ambulatory Visit: Payer: Self-pay

## 2022-10-23 ENCOUNTER — Telehealth: Payer: Self-pay

## 2022-10-23 DIAGNOSIS — Z1211 Encounter for screening for malignant neoplasm of colon: Secondary | ICD-10-CM

## 2022-10-23 MED ORDER — NA SULFATE-K SULFATE-MG SULF 17.5-3.13-1.6 GM/177ML PO SOLN: 1.0000 | Freq: Once | ORAL | 0 refills | Status: AC

## 2022-10-23 NOTE — Telephone Encounter (Signed)
Gastroenterology Pre-Procedure Review  Request Date: 11/10/22 Requesting Physician: Dr. Marius Ditch  PATIENT REVIEW QUESTIONS: The patient responded to the following health history questions as indicated:    1. Are you having any GI issues? no 2. Do you have a personal history of Polyps? no 3. Do you have a family history of Colon Cancer or Polyps? no 4. Diabetes Mellitus? no 5. Joint replacements in the past 12 months?no 6. Major health problems in the past 3 months?no 7. Any artificial heart valves, MVP, or defibrillator?no    MEDICATIONS & ALLERGIES:    Patient reports the following regarding taking any anticoagulation/antiplatelet therapy:   Plavix, Coumadin, Eliquis, Xarelto, Lovenox, Pradaxa, Brilinta, or Effient? no Aspirin? no  Patient confirms/reports the following medications:  Current Outpatient Medications  Medication Sig Dispense Refill   ALPRAZolam (XANAX) 0.25 MG tablet Take 1 tablet (0.25 mg total) by mouth 2 (two) times daily as needed for anxiety. 30 tablet 0   norethindrone-ethinyl estradiol (LOESTRIN) 1-20 MG-MCG tablet Take 1 tablet by mouth daily. 84 tablet 3   No current facility-administered medications for this visit.    Patient confirms/reports the following allergies:  No Known Allergies  No orders of the defined types were placed in this encounter.   AUTHORIZATION INFORMATION Primary Insurance: 1D#: Group #:  Secondary Insurance: 1D#: Group #:  SCHEDULE INFORMATION: Date:  Time: Location:

## 2022-10-31 ENCOUNTER — Ambulatory Visit
Admission: RE | Admit: 2022-10-31 | Discharge: 2022-10-31 | Disposition: A | Discharge status: Home / Self Care | Payer: BC Managed Care – PPO | Source: Ambulatory Visit | Attending: Obstetrics and Gynecology | Admitting: Obstetrics and Gynecology

## 2022-10-31 DIAGNOSIS — Z1231 Encounter for screening mammogram for malignant neoplasm of breast: Secondary | ICD-10-CM | POA: Insufficient documentation

## 2022-11-08 ENCOUNTER — Other Ambulatory Visit: Payer: Self-pay | Admitting: Obstetrics and Gynecology

## 2022-11-08 DIAGNOSIS — Z3041 Encounter for surveillance of contraceptive pills: Secondary | ICD-10-CM

## 2022-11-10 ENCOUNTER — Ambulatory Visit: Payer: BC Managed Care – PPO | Admitting: Anesthesiology

## 2022-11-10 ENCOUNTER — Ambulatory Visit
Admission: RE | Admit: 2022-11-10 | Discharge: 2022-11-10 | Disposition: A | Discharge status: Home / Self Care | Payer: BC Managed Care – PPO | Attending: Gastroenterology | Admitting: Gastroenterology

## 2022-11-10 ENCOUNTER — Encounter
Admission: RE | Disposition: A | Discharge status: Home / Self Care | Payer: Self-pay | Source: Home / Self Care | Attending: Gastroenterology

## 2022-11-10 ENCOUNTER — Encounter: Payer: Self-pay | Admitting: Gastroenterology

## 2022-11-10 DIAGNOSIS — F410 Panic disorder [episodic paroxysmal anxiety] without agoraphobia: Secondary | ICD-10-CM | POA: Insufficient documentation

## 2022-11-10 DIAGNOSIS — Z1211 Encounter for screening for malignant neoplasm of colon: Secondary | ICD-10-CM | POA: Diagnosis not present

## 2022-11-10 DIAGNOSIS — D125 Benign neoplasm of sigmoid colon: Secondary | ICD-10-CM | POA: Diagnosis not present

## 2022-11-10 DIAGNOSIS — D126 Benign neoplasm of colon, unspecified: Secondary | ICD-10-CM | POA: Diagnosis not present

## 2022-11-10 DIAGNOSIS — D124 Benign neoplasm of descending colon: Secondary | ICD-10-CM | POA: Diagnosis not present

## 2022-11-10 DIAGNOSIS — F32A Depression, unspecified: Secondary | ICD-10-CM | POA: Insufficient documentation

## 2022-11-10 HISTORY — PX: COLONOSCOPY WITH PROPOFOL: SHX5780

## 2022-11-10 LAB — POCT PREGNANCY, URINE: Preg Test, Ur: NEGATIVE

## 2022-11-10 SURGERY — COLONOSCOPY WITH PROPOFOL
Anesthesia: General

## 2022-11-10 MED ORDER — LIDOCAINE HCL (CARDIAC) PF 100 MG/5ML IV SOSY
PREFILLED_SYRINGE | INTRAVENOUS | Status: DC | PRN
  Administered 2022-11-10: 50 mg via INTRAVENOUS

## 2022-11-10 MED ORDER — SODIUM CHLORIDE 0.9 % IV SOLN: INTRAVENOUS | Status: DC

## 2022-11-10 MED ORDER — PROPOFOL 500 MG/50ML IV EMUL
INTRAVENOUS | Status: DC | PRN
  Administered 2022-11-10: 150 ug/kg/min via INTRAVENOUS

## 2022-11-10 MED ORDER — PROPOFOL 10 MG/ML IV BOLUS
INTRAVENOUS | Status: DC | PRN
  Administered 2022-11-10: 100 mg via INTRAVENOUS

## 2022-11-10 NOTE — Transfer of Care (Signed)
Immediate Anesthesia Transfer of Care Note  Patient: Tammy Wilkins  Procedure(s) Performed: COLONOSCOPY WITH PROPOFOL  Patient Location: Endoscopy Unit  Anesthesia Type:General  Level of Consciousness: awake and drowsy  Airway & Oxygen Therapy: Patient Spontanous Breathing  Post-op Assessment: Report given to RN and Post -op Vital signs reviewed and stable  Post vital signs: Reviewed and stable  Last Vitals:  Vitals Value Taken Time  BP 82/46 11/10/22 1110  Temp    Pulse 97 11/10/22 1110  Resp 0 11/10/22 1110  SpO2 100 % 11/10/22 1110  Vitals shown include unvalidated device data.  Last Pain:  Vitals:   11/10/22 0949  TempSrc: Temporal         Complications: No notable events documented.

## 2022-11-10 NOTE — H&P (Signed)
Jonathon Bellows, MD [address], [place], [place], [place], [address] [address], [place], [place], [place], [address] Phone: [phone]  Fax: [phone]  Primary Care Physician:  Jonetta Osgood, NP   Pre-Procedure History & Physical: HPI:  Tammy Wilkins is a 51 y.o. female is here for an colonoscopy.   Past Medical History:  Diagnosis Date   Anxiety    History of mammogram 02/01/2015   birad 1   History of Papanicolaou smear of cervix 01/06/2013   neg/neg   Insomnia    Mild depression    Panic attacks    Screening for colon cancer 11/2020   NEG Cologuard; repeat after 3 yrs    Past Surgical History:  Procedure Laterality Date   APPENDECTOMY  2013   AUGMENTATION MAMMAPLASTY Bilateral 2003   [facility] [place]   all four    Prior to Admission medications   Medication Sig Start Date End Date Taking? Authorizing Provider  norethindrone-ethinyl estradiol (LOESTRIN) 1-20 MG-MCG tablet TAKE 1 TABLET BY MOUTH DAILY 52/8/41  Yes Copland, Alicia B, PA-C  pseudoephedrine (SUDAFED) 30 MG tablet Take 30 mg by mouth every 4 (four) hours as needed for congestion.   Yes [provider]  ALPRAZolam (XANAX) 0.25 MG tablet Take 1 tablet (0.25 mg total) by mouth 2 (two) times daily as needed for anxiety. 32/44/01   Copland, Alicia B, PA-C    Allergies as of 10/23/2022   (No Known Allergies)    Family History  Problem Relation Age of Onset   Heart attack Father 33       passed away   Diabetes Father    Lung disease Father        TB   Diabetes Maternal Grandmother    Cancer Maternal Grandfather 86       lung   Diabetes Maternal Grandfather    Diabetes Paternal Grandmother    Diabetes Paternal Grandfather    Breast cancer Neg Hx     Social History   Socioeconomic History   Marital status: Married    Spouse name: Not on file   Number of children: Not on file   Years of education: Not on file   Highest education  level: Not on file  Occupational History   Not on file  Tobacco Use   Smoking status: Never   Smokeless tobacco: Never  Vaping Use   Vaping Use: Never used  Substance and Sexual Activity   Alcohol use: Yes    Comment: socially   Drug use: No   Sexual activity: Yes    Partners: Male    Birth control/protection: Pill  Other Topics Concern   Not on file  Social History Narrative   Not on file   Social Determinants of Health   Financial Resource Strain: Not on file  Food Insecurity: Not on file  Transportation Needs: Not on file  Physical Activity: Not on file  Stress: Not on file  Social Connections: Not on file  Intimate Partner Violence: Not on file    Review of Systems: See HPI, otherwise negative ROS  Physical Exam: BP 114/70   Pulse 79   Temp (!) 97.2 F (36.2 C) (Temporal)   Resp 16   Ht '5\' 2"'$  (1.575 m)   Wt 55.3 kg   SpO2 100%   BMI 22.31 kg/m  General:   Alert,  pleasant and cooperative in NAD Head:  Normocephalic and atraumatic.  Neck:  Supple; no masses or thyromegaly. Lungs:  Clear throughout to auscultation, normal respiratory effort.    Heart:  +S1, +S2, Regular rate and rhythm, No edema. Abdomen:  Soft, nontender and nondistended. Normal bowel sounds, without guarding, and without rebound.   Neurologic:  Alert and  oriented x4;  grossly normal neurologically.  Impression/Plan: Tammy Wilkins is here for an colonoscopy to be performed for Screening colonoscopy average risk   Risks, benefits, limitations, and alternatives regarding  colonoscopy have been reviewed with the patient.  Questions have been answered.  All parties agreeable.   Jonathon Bellows, MD  11/10/2022, 10:37 AM

## 2022-11-10 NOTE — Op Note (Signed)
[facility] Patient Name: Tammy Wilkins Overall Procedure Date: 11/10/2022 10:28 AM MRN: [id number] Account #: [id number] Date of Birth: 04/09/1971 Admit Type: Outpatient Age: 51 Room: [facility] ENDO ROOM 4 Gender: Female Note Status: Finalized Instrument Name: Peds Colonoscope [id number] Procedure:             Colonoscopy Indications:           Screening for colorectal malignant neoplasm Providers:             Jonathon Bellows MD, MD Medicines:             Monitored Anesthesia Care Complications:         No immediate complications. Procedure:             Pre-Anesthesia Assessment:                        - Prior to the procedure, a History and Physical was                         performed, and patient medications, allergies and                         sensitivities were reviewed. The patient's tolerance                         of previous anesthesia was reviewed.                        - The risks and benefits of the procedure and the                         sedation options and risks were discussed with the                         patient. All questions were answered and informed                         consent was obtained.                        - ASA Grade Assessment: II - A patient with mild                         systemic disease.                        After obtaining informed consent, the colonoscope was                         passed under direct vision. Throughout the procedure,                         the patient's blood pressure, pulse, and oxygen                         saturations were monitored continuously. The                         Colonoscope was introduced through the anus and  advanced to the the cecum, identified by the                         appendiceal orifice. The colonoscopy was performed                         with ease. The patient tolerated the procedure well.                         The quality of the bowel  preparation was excellent.                         The ileocecal valve, appendiceal orifice, and rectum                         were photographed. Findings:      The perianal and digital rectal examinations were normal.      A 12 mm polyp was found in the sigmoid colon. The polyp was       pedunculated. The polyp was removed with a hot snare. Resection and       retrieval were complete.      A 5 mm polyp was found in the descending colon. The polyp was sessile.       The polyp was removed with a cold biopsy forceps. Resection and       retrieval were complete.      The exam was otherwise without abnormality on direct and retroflexion       views. Impression:            - One 12 mm polyp in the sigmoid colon, removed with a                         hot snare. Resected and retrieved.                        - One 5 mm polyp in the descending colon, removed with                         a cold biopsy forceps. Resected and retrieved.                        - The examination was otherwise normal on direct and                         retroflexion views. Recommendation:        - Discharge patient to home (with escort).                        - Resume previous diet.                        - Continue present medications.                        - Await pathology results.                        - Repeat colonoscopy for surveillance based on  pathology results. Procedure Code(s):     --- Professional ---                        [phone], Colonoscopy, flexible; with removal of                         tumor(s), polyp(s), or other lesion(s) by snare                         technique                        [id number], 44, Colonoscopy, flexible; with biopsy, single                         or multiple Diagnosis Code(s):     --- Professional ---                        Z12.11, Encounter for screening for malignant neoplasm                         of colon                        D12.5, Benign  neoplasm of sigmoid colon                        D12.4, Benign neoplasm of descending colon CPT copyright 2022 American Medical Association. All rights reserved. The codes documented in this report are preliminary and upon coder review may  be revised to meet current compliance requirements. Jonathon Bellows, MD Jonathon Bellows MD, MD 11/10/2022 11:05:59 AM This report has been signed electronically. Number of Addenda: 0 Note Initiated On: 11/10/2022 10:28 AM Scope Withdrawal Time: 0 hours 15 minutes 35 seconds  Total Procedure Duration: 0 hours 18 minutes 53 seconds  Estimated Blood Loss:  Estimated blood loss: none.      [facility]

## 2022-11-10 NOTE — Anesthesia Preprocedure Evaluation (Addendum)
Anesthesia Evaluation  Patient identified by MRN, date of birth, ID band Patient awake    Reviewed: Allergy & Precautions, NPO status , Patient's Chart, lab work & pertinent test results  History of Anesthesia Complications Negative for: history of anesthetic complications  Airway Mallampati: I   Neck ROM: Full    Dental no notable dental hx.    Pulmonary neg pulmonary ROS   Pulmonary exam normal breath sounds clear to auscultation       Cardiovascular Exercise Tolerance: Good negative cardio ROS Normal cardiovascular exam Rhythm:Regular Rate:Normal  ECG 10/19/22:  Sinus Rhythm  -RSR(V1) -nondiagnostic   Neuro/Psych  PSYCHIATRIC DISORDERS (panic disorder) Anxiety Depression    negative neurological ROS     GI/Hepatic negative GI ROS,,,  Endo/Other  negative endocrine ROS    Renal/GU negative Renal ROS     Musculoskeletal   Abdominal   Peds  Hematology negative hematology ROS (+)   Anesthesia Other Findings   Reproductive/Obstetrics                             Anesthesia Physical Anesthesia Plan  ASA: 2  Anesthesia Plan: General   Post-op Pain Management:    Induction: Intravenous  PONV Risk Score and Plan: 3 and Propofol infusion, TIVA and Treatment may vary due to age or medical condition  Airway Management Planned: Natural Airway  Additional Equipment:   Intra-op Plan:   Post-operative Plan:   Informed Consent: I have reviewed the patients History and Physical, chart, labs and discussed the procedure including the risks, benefits and alternatives for the proposed anesthesia with the patient or authorized representative who has indicated his/her understanding and acceptance.       Plan Discussed with: CRNA  Anesthesia Plan Comments: (LMA/GETA backup discussed.  Patient consented for risks of anesthesia including but not limited to:  - adverse reactions to  medications - damage to eyes, teeth, lips or other oral mucosa - nerve damage due to positioning  - sore throat or hoarseness - damage to heart, brain, nerves, lungs, other parts of body or loss of life  Informed patient about role of CRNA in peri- and intra-operative care.  Patient voiced understanding.)        Anesthesia Quick Evaluation

## 2022-11-10 NOTE — Anesthesia Postprocedure Evaluation (Signed)
Anesthesia Post Note  Patient: DANAYAH SMYRE  Procedure(s) Performed: COLONOSCOPY WITH PROPOFOL  Patient location during evaluation: PACU Anesthesia Type: General Level of consciousness: awake and alert, oriented and patient cooperative Pain management: pain level controlled Vital Signs Assessment: post-procedure vital signs reviewed and stable Respiratory status: spontaneous breathing, nonlabored ventilation and respiratory function stable Cardiovascular status: blood pressure returned to baseline and stable Postop Assessment: adequate PO intake Anesthetic complications: no   No notable events documented.   Last Vitals:  Vitals:   11/10/22 1111 11/10/22 1134  BP: 103/61 (!) 99/58  Pulse:    Resp:    Temp:    SpO2:      Last Pain:  Vitals:   11/10/22 1149  TempSrc:   PainSc: 0-No pain                 Darrin Nipper

## 2022-11-10 NOTE — Anesthesia Procedure Notes (Signed)
Date/Time: 11/10/2022 10:39 AM  Performed by: Johnna Acosta, CRNAPre-anesthesia Checklist: Patient identified, Emergency Drugs available, Suction available and Patient being monitored Patient Re-evaluated:Patient Re-evaluated prior to induction Oxygen Delivery Method: Nasal cannula Preoxygenation: Pre-oxygenation with 100% oxygen Induction Type: IV induction

## 2022-11-13 ENCOUNTER — Encounter: Payer: Self-pay | Admitting: Gastroenterology

## 2022-11-13 LAB — SURGICAL PATHOLOGY

## 2022-11-20 NOTE — Progress Notes (Unsigned)
PCP:  Jonetta Osgood, NP   No chief complaint on file.    HPI:      [name] is a 51 y.o. [id number] who LMP was No LMP recorded. (Menstrual status: Oral contraceptives)., presents today for her annual examination.  Her menses are infrequent with OCPs, lasting a few days, light to med flow. No dysmen, no BTB. Started OCPs in past for heavier flow with good sx relief.  Occas vasomotor sx.  Sex activity: single partner, contraception - OCPs. No pain/bleeding Last Pap: 10/19/20  Results were: no abnormalities /neg HPV DNA  Hx of STDs: none  Last mammogram: 10/31/22  Results were: normal--routine follow-up in 12 months There is no FH of breast cancer. There is no FH of ovarian cancer. The patient does do self-breast exams.  Tobacco use: The patient denies current or previous tobacco use. Alcohol use: social drinker No drug use.  Exercise: moderately active  Colonoscopy: 11/10/22 with polyp at [facility]; repeat after 3 yrs.  Neg Cologuard 12/21  She does get adequate calcium and Vitamin D in her diet.  She takes xanax sparingly for anxiety. Needs RF  Past Medical History:  Diagnosis Date   Anxiety    History of mammogram 02/01/2015   birad 1   History of Papanicolaou smear of cervix 01/06/2013   neg/neg   Insomnia    Mild depression    Panic attacks    Screening for colon cancer 11/2020   NEG Cologuard; repeat after 3 yrs    Past Surgical History:  Procedure Laterality Date   APPENDECTOMY  2013   AUGMENTATION MAMMAPLASTY Bilateral 2003   COLONOSCOPY WITH PROPOFOL N/A 11/10/2022   Procedure: COLONOSCOPY WITH PROPOFOL;  Surgeon: Jonathon Bellows, MD;  Location: [facility] ENDOSCOPY;  Service: Gastroenterology;  Laterality: N/A;   EYE SURGERY  1974   WISDOM TOOTH EXTRACTION  1997   all four    Family History  Problem Relation Age of Onset   Heart attack Father 79       passed away   Diabetes Father    Lung disease Father        TB   Diabetes Maternal  Grandmother    Cancer Maternal Grandfather 55       lung   Diabetes Maternal Grandfather    Diabetes Paternal Grandmother    Diabetes Paternal Grandfather    Breast cancer Neg Hx     Social History   Socioeconomic History   Marital status: Married    Spouse name: Not on file   Number of children: Not on file   Years of education: Not on file   Highest education level: Not on file  Occupational History   Not on file  Tobacco Use   Smoking status: Never   Smokeless tobacco: Never  Vaping Use   Vaping Use: Never used  Substance and Sexual Activity   Alcohol use: Yes    Comment: socially   Drug use: No   Sexual activity: Yes    Partners: Male    Birth control/protection: Pill  Other Topics Concern   Not on file  Social History Narrative   Not on file   Social Determinants of Health   Financial Resource Strain: Not on file  Food Insecurity: Not on file  Transportation Needs: Not on file  Physical Activity: Not on file  Stress: Not on file  Social Connections: Not on file  Intimate Partner Violence: Not on file    No outpatient medications  have been marked as taking for the 11/21/22 encounter (Appointment) with Amoura Ransier, Deirdre Evener, PA-C.     ROS:  Review of Systems  Constitutional:  Negative for fatigue, fever and unexpected weight change.  Respiratory:  Negative for cough, shortness of breath and wheezing.   Cardiovascular:  Negative for chest pain, palpitations and leg swelling.  Gastrointestinal:  Negative for blood in stool, constipation, diarrhea, nausea and vomiting.  Endocrine: Negative for cold intolerance, heat intolerance and polyuria.  Genitourinary:  Negative for dyspareunia, dysuria, flank pain, frequency, genital sores, hematuria, menstrual problem, pelvic pain, urgency, vaginal bleeding, vaginal discharge and vaginal pain.  Musculoskeletal:  Negative for back pain, joint swelling and myalgias.  Skin:  Negative for rash.  Neurological:  Negative for  dizziness, syncope, light-headedness, numbness and headaches.  Hematological:  Negative for adenopathy.  Psychiatric/Behavioral:  Positive for agitation. Negative for confusion, sleep disturbance and suicidal ideas. The patient is not nervous/anxious.      Objective: There were no vitals taken for this visit.   Physical Exam Constitutional:      Appearance: She is well-developed.  Genitourinary:     Vulva normal.     Right Labia: No rash, tenderness or lesions.    Left Labia: No tenderness, lesions or rash.    No vaginal discharge, erythema or tenderness.      Right Adnexa: not tender and no mass present.    Left Adnexa: not tender and no mass present.    No cervical motion tenderness, friability or polyp.     Uterus is not enlarged or tender.  Breasts:    Right: No mass, nipple discharge, skin change or tenderness.     Left: No mass, nipple discharge, skin change or tenderness.  Neck:     Thyroid: No thyromegaly.  Cardiovascular:     Rate and Rhythm: Normal rate and regular rhythm.     Heart sounds: Normal heart sounds. No murmur heard. Pulmonary:     Effort: Pulmonary effort is normal.     Breath sounds: Normal breath sounds.  Abdominal:     Palpations: Abdomen is soft.     Tenderness: There is no abdominal tenderness. There is no guarding or rebound.  Musculoskeletal:        General: Normal range of motion.     Cervical back: Normal range of motion.  Lymphadenopathy:     Cervical: No cervical adenopathy.  Neurological:     General: No focal deficit present.     Mental Status: She is alert and oriented to person, place, and time.     Cranial Nerves: No cranial nerve deficit.  Skin:    General: Skin is warm and dry.  Psychiatric:        Mood and Affect: Mood normal.        Behavior: Behavior normal.        Thought Content: Thought content normal.        Judgment: Judgment normal.  Vitals reviewed.     Assessment/Plan: Encounter for annual routine  gynecological examination  Encounter for surveillance of contraceptive pills - Plan: norethindrone-ethinyl estradiol (LOESTRIN) 1-20 MG-MCG tablet; OCP RF. Will re-eval next yr.  Encounter for screening mammogram for malignant neoplasm of breast; pt current on mammo  Anxiety - Plan: ALPRAZolam (XANAX) 0.25 MG tablet; Rx RF. F/u prn.    No orders of the defined types were placed in this encounter.             GYN counsel mammography screening, use and  side effects of OCP's, adequate intake of calcium and vitamin D, diet and exercise     F/U  No follow-ups on file.  Gahel Safley B. Gianna Calef, PA-C 11/20/2022 8:01 PM

## 2022-11-21 ENCOUNTER — Telehealth: Payer: Self-pay | Admitting: Nurse Practitioner

## 2022-11-21 ENCOUNTER — Ambulatory Visit (INDEPENDENT_AMBULATORY_CARE_PROVIDER_SITE_OTHER): Payer: BC Managed Care – PPO | Admitting: Obstetrics and Gynecology

## 2022-11-21 ENCOUNTER — Encounter: Payer: Self-pay | Admitting: Obstetrics and Gynecology

## 2022-11-21 VITALS — BP 100/64 | Ht 62.0 in | Wt 123.0 lb

## 2022-11-21 DIAGNOSIS — Z01411 Encounter for gynecological examination (general) (routine) with abnormal findings: Secondary | ICD-10-CM

## 2022-11-21 DIAGNOSIS — F419 Anxiety disorder, unspecified: Secondary | ICD-10-CM | POA: Diagnosis not present

## 2022-11-21 DIAGNOSIS — N951 Menopausal and female climacteric states: Secondary | ICD-10-CM

## 2022-11-21 DIAGNOSIS — Z01419 Encounter for gynecological examination (general) (routine) without abnormal findings: Secondary | ICD-10-CM

## 2022-11-21 DIAGNOSIS — Z1231 Encounter for screening mammogram for malignant neoplasm of breast: Secondary | ICD-10-CM

## 2022-11-21 DIAGNOSIS — Z3041 Encounter for surveillance of contraceptive pills: Secondary | ICD-10-CM

## 2022-11-21 MED ORDER — ALPRAZOLAM 0.25 MG PO TABS: 0.2500 mg | ORAL_TABLET | Freq: Two times a day (BID) | ORAL | 0 refills | Status: DC | PRN

## 2022-11-21 NOTE — Telephone Encounter (Signed)
Left vm and sent mychart message to confirm 11/28/22 appointment-Toni

## 2022-11-21 NOTE — Patient Instructions (Signed)
I value your feedback and you entrusting us with your care. If you get a [facility] patient survey, I would appreciate you taking the time to let us know about your experience today. Thank you! ? ? ?

## 2022-11-22 LAB — CBC WITH DIFFERENTIAL/PLATELET
Basophils Absolute: 0 10*3/uL (ref 0.0–0.2)
Basos: 1 %
EOS (ABSOLUTE): 0.1 10*3/uL (ref 0.0–0.4)
Eos: 1 %
Hematocrit: 39.8 % (ref 34.0–46.6)
Hemoglobin: 13.6 g/dL (ref 11.1–15.9)
Immature Grans (Abs): 0 10*3/uL (ref 0.0–0.1)
Immature Granulocytes: 1 %
Lymphocytes Absolute: 1.9 10*3/uL (ref 0.7–3.1)
Lymphs: 30 %
MCH: 29.8 pg (ref 26.6–33.0)
MCHC: 34.2 g/dL (ref 31.5–35.7)
MCV: 87 fL (ref 79–97)
Monocytes Absolute: 0.5 10*3/uL (ref 0.1–0.9)
Monocytes: 7 %
Neutrophils Absolute: 3.8 10*3/uL (ref 1.4–7.0)
Neutrophils: 60 %
Platelets: 412 10*3/uL (ref 150–450)
RBC: 4.56 x10E6/uL (ref 3.77–5.28)
RDW: 11.9 % (ref 11.7–15.4)
WBC: 6.2 10*3/uL (ref 3.4–10.8)

## 2022-11-22 LAB — IRON,TIBC AND FERRITIN PANEL
Ferritin: 236 ng/mL — ABNORMAL HIGH (ref 15–150)
Iron Saturation: 50 % (ref 15–55)
Iron: 126 ug/dL (ref 27–159)
Total Iron Binding Capacity: 250 ug/dL (ref 250–450)
UIBC: 124 ug/dL — ABNORMAL LOW (ref 131–425)

## 2022-11-22 LAB — CMP14+EGFR
ALT: 24 IU/L (ref 0–32)
AST: 18 IU/L (ref 0–40)
Albumin/Globulin Ratio: 1.6 (ref 1.2–2.2)
Albumin: 4.1 g/dL (ref 3.9–4.9)
Alkaline Phosphatase: 50 IU/L (ref 44–121)
BUN/Creatinine Ratio: 10 (ref 9–23)
BUN: 8 mg/dL (ref 6–24)
Bilirubin Total: 0.3 mg/dL (ref 0.0–1.2)
CO2: 22 mmol/L (ref 20–29)
Calcium: 9.6 mg/dL (ref 8.7–10.2)
Chloride: 105 mmol/L (ref 96–106)
Creatinine, Ser: 0.77 mg/dL (ref 0.57–1.00)
Globulin, Total: 2.6 g/dL (ref 1.5–4.5)
Glucose: 85 mg/dL (ref 70–99)
Potassium: 4.7 mmol/L (ref 3.5–5.2)
Sodium: 138 mmol/L (ref 134–144)
Total Protein: 6.7 g/dL (ref 6.0–8.5)
eGFR: 94 mL/min/{1.73_m2} (ref >59)

## 2022-11-22 LAB — LIPID PANEL
Chol/HDL Ratio: 3.4 ratio (ref 0.0–4.4)
Cholesterol, Total: 184 mg/dL (ref 100–199)
HDL: 54 mg/dL (ref >39)
LDL Chol Calc (NIH): 113 mg/dL — ABNORMAL HIGH (ref 0–99)
Triglycerides: 94 mg/dL (ref 0–149)
VLDL Cholesterol Cal: 17 mg/dL (ref 5–40)

## 2022-11-22 LAB — VITAMIN D 25 HYDROXY (VIT D DEFICIENCY, FRACTURES): Vit D, 25-Hydroxy: 29.7 ng/mL — ABNORMAL LOW (ref 30.0–100.0)

## 2022-11-22 LAB — TSH+FREE T4
Free T4: 1.2 ng/dL (ref 0.82–1.77)
TSH: 2.13 u[IU]/mL (ref 0.450–4.500)

## 2022-11-22 LAB — B12 AND FOLATE PANEL
Folate: 4.6 ng/mL (ref >3.0)
Vitamin B-12: 388 pg/mL (ref 232–1245)

## 2022-11-28 ENCOUNTER — Encounter: Payer: Self-pay | Admitting: Nurse Practitioner

## 2022-11-28 ENCOUNTER — Ambulatory Visit (INDEPENDENT_AMBULATORY_CARE_PROVIDER_SITE_OTHER): Payer: BC Managed Care – PPO | Admitting: Nurse Practitioner

## 2022-11-28 VITALS — BP 97/65 | HR 68 | Temp 98.5°F | Resp 16 | Ht 62.0 in | Wt 122.4 lb

## 2022-11-28 DIAGNOSIS — E559 Vitamin D deficiency, unspecified: Secondary | ICD-10-CM

## 2022-11-28 DIAGNOSIS — R300 Dysuria: Secondary | ICD-10-CM

## 2022-11-28 DIAGNOSIS — Z0001 Encounter for general adult medical examination with abnormal findings: Secondary | ICD-10-CM

## 2022-11-28 NOTE — Progress Notes (Signed)
[facility] [place], [place] [address]  Internal MEDICINE  Office Visit Note  Patient Name: Tammy Wilkins  [id number]  [id number]  Date of Service: 11/28/2022  Chief Complaint  Patient presents with   Annual Exam   Depression   Quality Metric Gaps    Shingles and TDAP    HPI Tammy Wilkins presents for an annual well visit and physical exam.  Well-appearing 51 y.o. female with no major medical problems Routine CRC screening: done on 11/10/22, repeat in 3 years Routine mammogram: done with OBGYN Labs: recent lab results discussed with patient  New or worsening pain: none Other concerns: none Slightly low vitamin D 29.7 -- achy, tingling and numbness in toes Takes alprazolam as needed, prescribed by OBGYN for prn anxiety attacks.       11/28/2022   10:08 AM  Depression screen PHQ 2/9  Decreased Interest 0  Down, Depressed, Hopeless 0  PHQ - 2 Score 0     Current Medication: Outpatient Encounter Medications as of 11/28/2022  Medication Sig   ALPRAZolam (XANAX) 0.25 MG tablet Take 1 tablet (0.25 mg total) by mouth 2 (two) times daily as needed for anxiety.   [DISCONTINUED] norethindrone-ethinyl estradiol (LOESTRIN) 1-20 MG-MCG tablet TAKE 1 TABLET BY MOUTH DAILY   No facility-administered encounter medications on file as of 11/28/2022.    Surgical History: Past Surgical History:  Procedure Laterality Date   APPENDECTOMY  2013   AUGMENTATION MAMMAPLASTY Bilateral 2003   COLONOSCOPY WITH PROPOFOL N/A 11/10/2022   Procedure: COLONOSCOPY WITH PROPOFOL;  Surgeon: Jonathon Bellows, MD;  Location: [facility] ENDOSCOPY;  Service: Gastroenterology;  Laterality: N/A;   EYE SURGERY  1974   WISDOM TOOTH EXTRACTION  1997   all four    Medical History: Past Medical History:  Diagnosis Date   Anxiety    History of mammogram 02/01/2015   birad 1   History of Papanicolaou smear of cervix 01/06/2013   neg/neg   Insomnia    Mild depression    Panic attacks     Screening for colon cancer 11/2020   NEG Cologuard; repeat after 3 yrs    Family History: Family History  Problem Relation Age of Onset   Heart attack Mother    Heart attack Father 55       passed away   Diabetes Father    Lung disease Father        TB   Diabetes Maternal Grandmother    Cancer Maternal Grandfather 67       lung   Diabetes Maternal Grandfather    Diabetes Paternal Grandmother    Diabetes Paternal Grandfather    Breast cancer Neg Hx     Social History   Socioeconomic History   Marital status: Married    Spouse name: Not on file   Number of children: Not on file   Years of education: Not on file   Highest education level: Not on file  Occupational History   Not on file  Tobacco Use   Smoking status: Never   Smokeless tobacco: Never  Vaping Use   Vaping Use: Never used  Substance and Sexual Activity   Alcohol use: Yes    Comment: socially   Drug use: No   Sexual activity: Yes    Partners: Male    Birth control/protection: Pill  Other Topics Concern   Not on file  Social History Narrative   Not on file   Social Determinants of Radio broadcast assistant  Strain: Not on file  Food Insecurity: Not on file  Transportation Needs: Not on file  Physical Activity: Not on file  Stress: Not on file  Social Connections: Not on file  Intimate Partner Violence: Not on file      Review of Systems  Constitutional:  Positive for fatigue. Negative for activity change, appetite change, chills, fever and unexpected weight change.  HENT: Negative.  Negative for congestion, ear pain, rhinorrhea, sore throat and trouble swallowing.   Eyes: Negative.   Respiratory: Negative.  Negative for cough, chest tightness, shortness of breath and wheezing.   Cardiovascular: Negative.  Negative for chest pain and palpitations.  Gastrointestinal: Negative.  Negative for abdominal pain, blood in stool, constipation, diarrhea, nausea and vomiting.  Endocrine: Negative.    Genitourinary: Negative.  Negative for difficulty urinating, dysuria, frequency, hematuria and urgency.  Musculoskeletal: Negative.  Negative for arthralgias, back pain, joint swelling, myalgias and neck pain.  Skin: Negative.  Negative for rash and wound.  Allergic/Immunologic: Negative.  Negative for immunocompromised state.  Neurological:  Positive for numbness (toes and tingling in toes). Negative for dizziness, seizures, weakness and headaches.  Hematological: Negative.   Psychiatric/Behavioral: Negative.  Negative for behavioral problems, self-injury and suicidal ideas. The patient is not nervous/anxious.     Vital Signs: BP 97/65   Pulse 68   Temp 98.5 F (36.9 C)   Resp 16   Ht '5\' 2"'$  (1.575 m)   Wt 122 lb 6.4 oz (55.5 kg)   LMP 10/12/2022 (Exact Date)   SpO2 98%   BMI 22.39 kg/m    Physical Exam Vitals reviewed.  Constitutional:      General: She is awake. She is not in acute distress.    Appearance: Normal appearance. She is well-developed, well-groomed and normal weight. She is not ill-appearing or diaphoretic.  HENT:     Head: Normocephalic and atraumatic.     Right Ear: Hearing, tympanic membrane, ear canal and external ear normal.     Left Ear: Hearing, tympanic membrane, ear canal and external ear normal.     Nose: Nose normal. No congestion or rhinorrhea.     Mouth/Throat:     Mouth: Mucous membranes are moist.     Pharynx: Oropharynx is clear. No oropharyngeal exudate or posterior oropharyngeal erythema.  Eyes:     General: Lids are normal. Vision grossly intact. Gaze aligned appropriately. No scleral icterus.       Right eye: No discharge.        Left eye: No discharge.     Extraocular Movements: Extraocular movements intact.     Conjunctiva/sclera: Conjunctivae normal.     Pupils: Pupils are equal, round, and reactive to light.  Neck:     Thyroid: No thyromegaly.     Vascular: No JVD.     Trachea: Trachea and phonation normal. No tracheal deviation.   Cardiovascular:     Rate and Rhythm: Normal rate and regular rhythm.     Pulses: Normal pulses.     Heart sounds: Normal heart sounds, S1 normal and S2 normal. No murmur heard.    No friction rub. No gallop.  Pulmonary:     Effort: Pulmonary effort is normal. No respiratory distress.     Breath sounds: Normal breath sounds. No stridor. No wheezing or rales.  Chest:     Chest wall: No tenderness.     Comments: Decline clinical breast exam, sees OBGYN and had mammogram recently Abdominal:     General: Bowel sounds are  normal. There is no distension.     Palpations: Abdomen is soft. There is no shifting dullness, fluid wave, mass or pulsatile mass.     Tenderness: There is no abdominal tenderness. There is no guarding or rebound.  Musculoskeletal:        General: No tenderness or deformity. Normal range of motion.     Cervical back: Normal range of motion and neck supple.     Right lower leg: No edema.     Left lower leg: No edema.  Lymphadenopathy:     Cervical: No cervical adenopathy.  Skin:    General: Skin is warm and dry.     Capillary Refill: Capillary refill takes less than 2 seconds.     Coloration: Skin is not pale.     Findings: No erythema or rash.  Neurological:     Mental Status: She is alert and oriented to person, place, and time.     Cranial Nerves: No cranial nerve deficit.     Motor: No abnormal muscle tone.     Coordination: Coordination normal.     Gait: Gait normal.     Deep Tendon Reflexes: Reflexes are normal and symmetric.  Psychiatric:        Mood and Affect: Mood and affect normal.        Behavior: Behavior normal. Behavior is cooperative.        Thought Content: Thought content normal.        Judgment: Judgment normal.        Assessment/Plan: 1. Encounter for routine adult health examination with abnormal findings Age-appropriate preventive screenings and vaccinations discussed, annual physical exam completed. Routine labs for health  maintenance drawn prior to office visit, results discussed today. PHM updated.   2. Vitamin D deficiency Recommended OTC supplement  2. Dysuria Routine urinalysis done - UA/M w/rflx Culture, Routine      General Counseling: Tammy Wilkins verbalizes understanding of the findings of todays visit and agrees with plan of treatment. I have discussed any further diagnostic evaluation that may be needed or ordered today. We also reviewed her medications today. she has been encouraged to call the office with any questions or concerns that should arise related to todays visit.    Orders Placed This Encounter  Procedures   UA/M w/rflx Culture, Routine    No orders of the defined types were placed in this encounter.   Return in about 1 year (around 11/29/2023) for CPE, [place] PCP and otherwise as needed. .   Total time spent:30 Minutes Time spent includes review of chart, medications, test results, and follow up plan with the patient.    Controlled Substance Database was reviewed by me.  This patient was seen by Jonetta Osgood, FNP-C in collaboration with Dr. Clayborn Bigness as a part of collaborative care agreement.  Aliviya Schoeller R. Valetta Fuller, MSN, FNP-C Internal medicine

## 2022-11-30 LAB — MICROSCOPIC EXAMINATION
Casts: NONE SEEN /lpf
RBC, Urine: NONE SEEN /hpf (ref 0–2)

## 2022-11-30 LAB — URINE CULTURE, REFLEX: Organism ID, Bacteria: NO GROWTH

## 2022-11-30 LAB — UA/M W/RFLX CULTURE, ROUTINE
Bilirubin, UA: NEGATIVE
Glucose, UA: NEGATIVE
Nitrite, UA: NEGATIVE
Protein,UA: NEGATIVE
RBC, UA: NEGATIVE
Specific Gravity, UA: 1.028 (ref 1.005–1.030)
Urobilinogen, Ur: 0.2 mg/dL (ref 0.2–1.0)
pH, UA: 5.5 (ref 5.0–7.5)

## 2022-12-04 ENCOUNTER — Encounter: Payer: Self-pay | Admitting: Nurse Practitioner

## 2023-01-16 ENCOUNTER — Encounter: Payer: Self-pay | Admitting: Obstetrics and Gynecology

## 2023-01-16 ENCOUNTER — Other Ambulatory Visit: Payer: Self-pay | Admitting: Obstetrics and Gynecology

## 2023-01-16 DIAGNOSIS — Z3041 Encounter for surveillance of contraceptive pills: Secondary | ICD-10-CM

## 2023-01-16 MED ORDER — NORETHINDRONE ACET-ETHINYL EST 1-20 MG-MCG PO TABS: 1.0000 | ORAL_TABLET | Freq: Every day | ORAL | 3 refills | Status: DC

## 2023-01-16 NOTE — Progress Notes (Signed)
Rx RF loestrin. Menses resumed off OCPs.

## 2023-04-04 NOTE — Telephone Encounter (Signed)
error 

## 2023-05-24 IMAGING — MG DIGITAL SCREENING BREAST BILAT IMPLANT W/ TOMO W/ CAD
8 of 12 series · 8 of 28 positions shown · non-contrast
Comparison: Previous exam(s).

CLINICAL DATA: Screening.

EXAM:
DIGITAL SCREENING BILATERAL MAMMOGRAM WITH IMPLANTS, CAD AND
TOMOSYNTHESIS
TECHNIQUE: Bilateral screening digital craniocaudal and mediolateral oblique
mammograms were obtained. Bilateral screening digital breast
tomosynthesis was performed. The images were evaluated with
computer-aided detection. Standard and/or implant displaced views
were performed.

[L MLO]
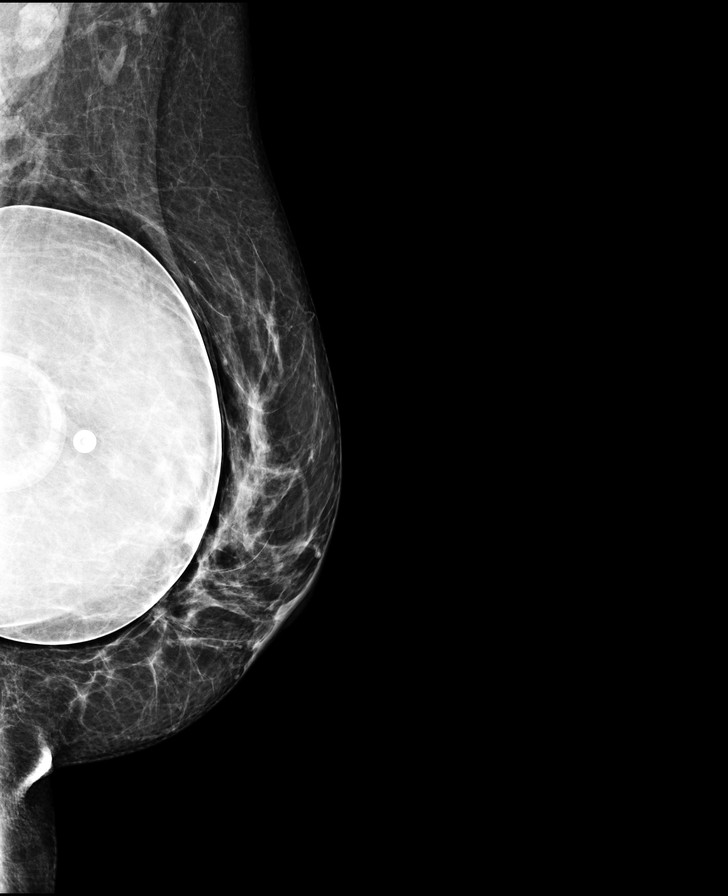

[R CC]
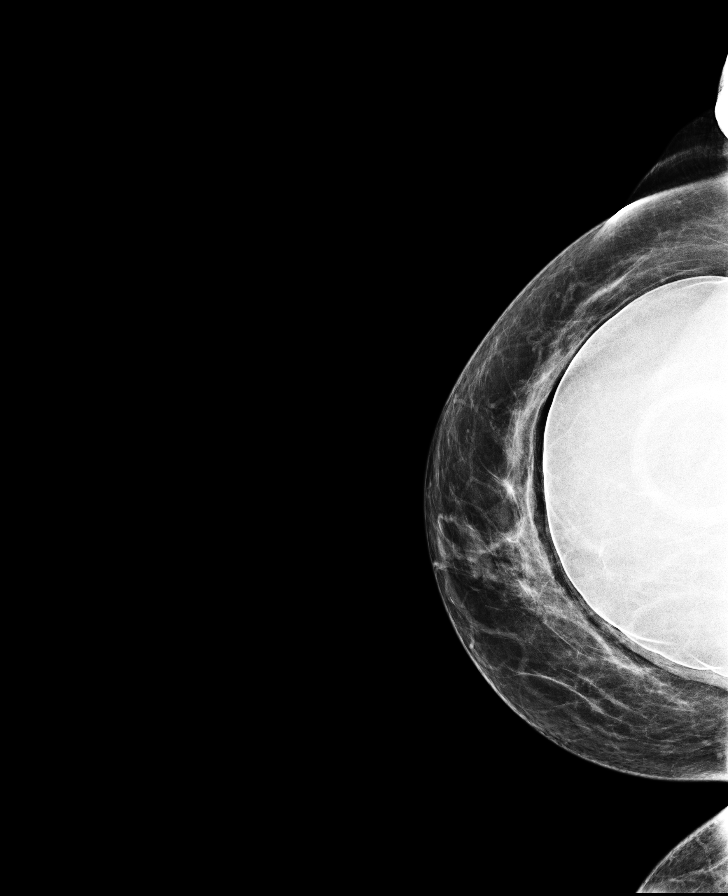

[L CC]
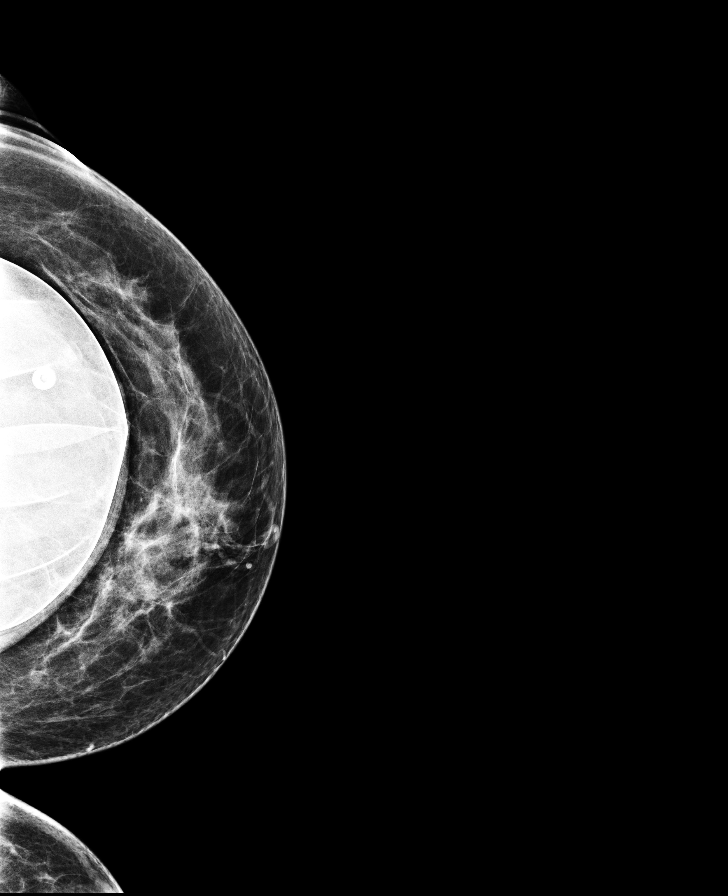

[R MLO]
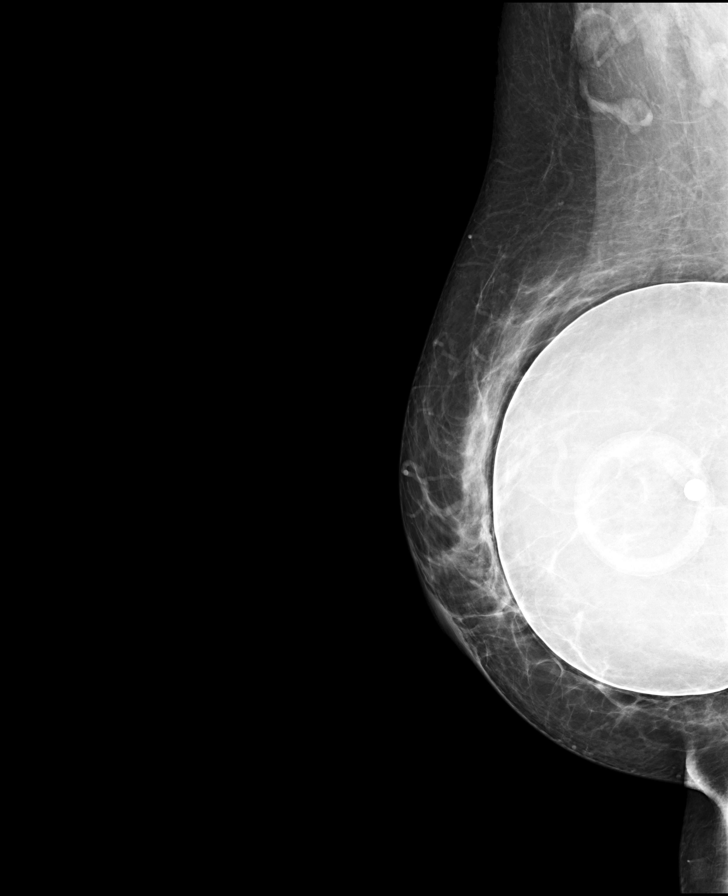

[L CC synth-2D]
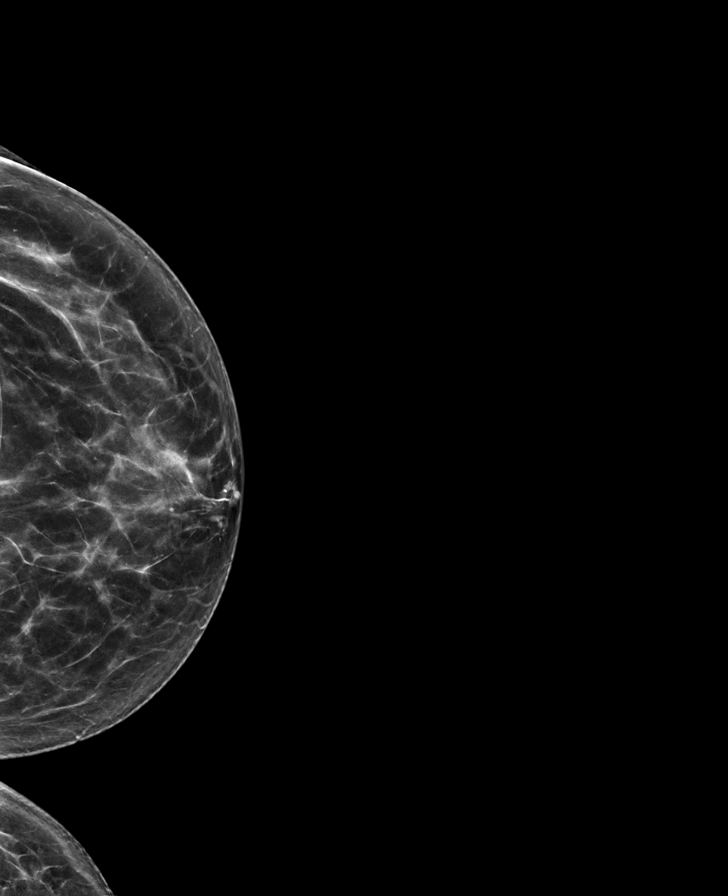

[R CC synth-2D]
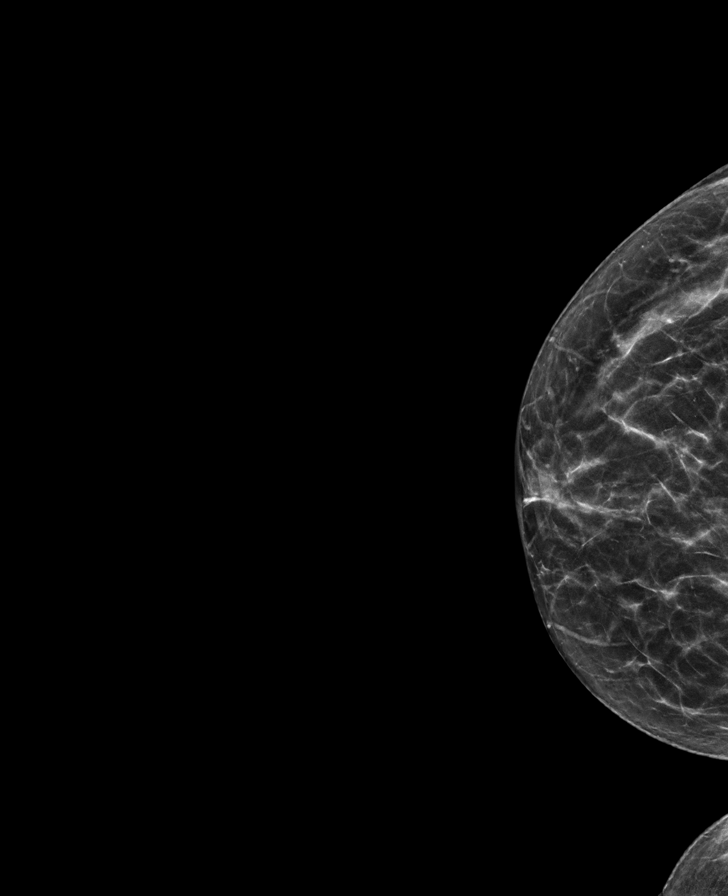

[R MLO synth-2D]
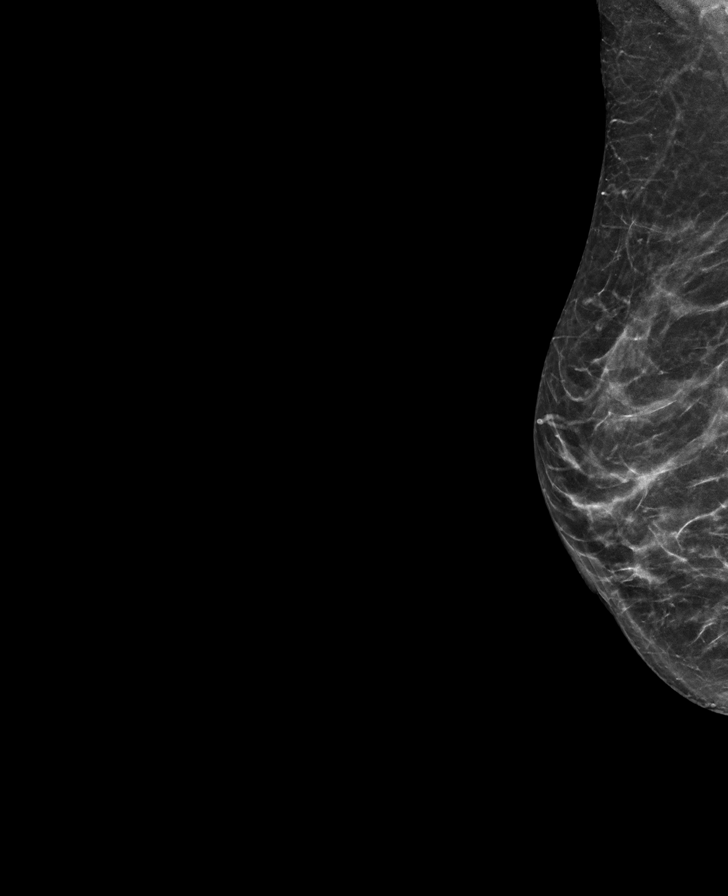

[L MLO synth-2D]
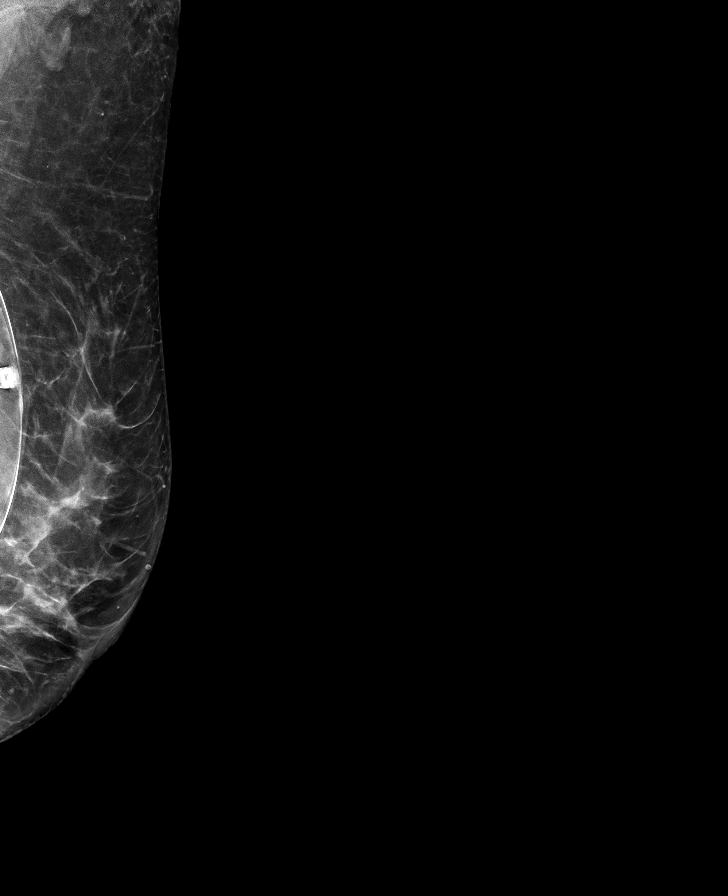

[8 of 28 positions shown; findings below may reference images not displayed]

ACR Breast Density Category c: The breast tissue is heterogeneously
dense, which may obscure small masses.
FINDINGS: The patient has retropectoral implants. There are no findings
suspicious for malignancy.
IMPRESSION: No mammographic evidence of malignancy. A result letter of this
screening mammogram will be mailed directly to the patient.

RECOMMENDATION:
Screening mammogram in one year. (Code:LT-E-7TH)

BI-RADS CATEGORY  1:  Negative.

## 2023-09-13 ENCOUNTER — Ambulatory Visit: Payer: BC Managed Care – PPO | Admitting: Dermatology

## 2023-10-01 ENCOUNTER — Ambulatory Visit: Payer: BC Managed Care – PPO | Admitting: Dermatology

## 2023-10-01 DIAGNOSIS — Z1283 Encounter for screening for malignant neoplasm of skin: Secondary | ICD-10-CM

## 2023-10-01 DIAGNOSIS — L729 Follicular cyst of the skin and subcutaneous tissue, unspecified: Secondary | ICD-10-CM

## 2023-10-01 DIAGNOSIS — L578 Other skin changes due to chronic exposure to nonionizing radiation: Secondary | ICD-10-CM | POA: Diagnosis not present

## 2023-10-01 DIAGNOSIS — D2239 Melanocytic nevi of other parts of face: Secondary | ICD-10-CM

## 2023-10-01 DIAGNOSIS — L720 Epidermal cyst: Secondary | ICD-10-CM

## 2023-10-01 DIAGNOSIS — D229 Melanocytic nevi, unspecified: Secondary | ICD-10-CM

## 2023-10-01 DIAGNOSIS — L814 Other melanin hyperpigmentation: Secondary | ICD-10-CM

## 2023-10-01 DIAGNOSIS — D2261 Melanocytic nevi of right upper limb, including shoulder: Secondary | ICD-10-CM

## 2023-10-01 DIAGNOSIS — L821 Other seborrheic keratosis: Secondary | ICD-10-CM

## 2023-10-01 DIAGNOSIS — W908XXA Exposure to other nonionizing radiation, initial encounter: Secondary | ICD-10-CM

## 2023-10-01 NOTE — Progress Notes (Signed)
   New Patient Visit   Subjective  Tammy Wilkins is a 52 y.o. female who presents for the following: Skin Cancer Screening and Full Body Skin Exam  The patient presents for Total-Body Skin Exam (TBSE) for skin cancer screening and mole check. The patient has spots, moles and lesions to be evaluated, some may be new or changing and the patient may have concern these could be cancer.  The following portions of the chart were reviewed this encounter and updated as appropriate: medications, allergies, medical history  Review of Systems:  No other skin or systemic complaints except as noted in HPI or Assessment and Plan.  Objective  Well appearing patient in no apparent distress; mood and affect are within normal limits.  A full examination was performed including scalp, head, eyes, ears, nose, lips, neck, chest, axillae, abdomen, back, buttocks, bilateral upper extremities, bilateral lower extremities, hands, feet, fingers, toes, fingernails, and toenails. All findings within normal limits unless otherwise noted below.   Relevant physical exam findings are noted in the Assessment and Plan.    Assessment & Plan   SKIN CANCER SCREENING PERFORMED TODAY.  ACTINIC DAMAGE - Chronic condition, secondary to cumulative UV/sun exposure - diffuse scaly erythematous macules with underlying dyspigmentation - Recommend daily broad spectrum sunscreen SPF 30+ to sun-exposed areas, reapply every 2 hours as needed.  - Staying in the shade or wearing long sleeves, sun glasses (UVA+UVB protection) and wide brim hats (4-inch brim around the entire circumference of the hat) are also recommended for sun protection.  - Call for new or changing lesions.  LENTIGINES, SEBORRHEIC KERATOSES, HEMANGIOMAS - Benign normal skin lesions - Benign-appearing - Call for any changes  MELANOCYTIC NEVI - Tan-brown and/or pink-flesh-colored symmetric macules and papules - Benign appearing on exam today - Observation -  Call clinic for new or changing moles - Recommend daily use of broad spectrum spf 30+ sunscreen to sun-exposed areas.   Milia - tiny firm white papules - type of cyst - benign - sometimes these will clear with nightly OTC adapalene/Differin 0.1% gel or retinol. - may be extracted if symptomatic - observe  MELANOCYTIC NEVI  Exam: Tan-brown and/or pink-flesh-colored symmetric macules and papules - R post shoulder 0.7 cm regular brown macule, pt states present for years with no change.  Treatment Plan: Benign appearing on exam today. Recommend observation. Call clinic for new or changing moles. Recommend daily use of broad spectrum spf 30+ sunscreen to sun-exposed areas.   Angiofibroma/Fibrous Papule - R nasal alar rim  - 0.2 cm smooth symmetric flesh colored to pink papule(s) without features suspicious for malignancy on dermoscopy - Benign-appearing.  Observation.  Call clinic for new or changing lesions.   Return for TBSE in 2 yrs .  Maylene Roes, CMA, am acting as scribe for Armida Sans, MD .   Documentation: I have reviewed the above documentation for accuracy and completeness, and I agree with the above.  Armida Sans, MD

## 2023-10-01 NOTE — Patient Instructions (Signed)

## 2023-10-13 ENCOUNTER — Encounter: Payer: Self-pay | Admitting: Dermatology

## 2023-12-03 ENCOUNTER — Ambulatory Visit (INDEPENDENT_AMBULATORY_CARE_PROVIDER_SITE_OTHER): Payer: BC Managed Care – PPO | Admitting: Nurse Practitioner

## 2023-12-03 ENCOUNTER — Encounter: Payer: Self-pay | Admitting: Nurse Practitioner

## 2023-12-03 VITALS — BP 110/66 | HR 79 | Temp 98.4°F | Resp 16 | Ht 62.0 in | Wt 125.4 lb

## 2023-12-03 DIAGNOSIS — E782 Mixed hyperlipidemia: Secondary | ICD-10-CM

## 2023-12-03 DIAGNOSIS — Z0001 Encounter for general adult medical examination with abnormal findings: Secondary | ICD-10-CM | POA: Diagnosis not present

## 2023-12-03 DIAGNOSIS — E559 Vitamin D deficiency, unspecified: Secondary | ICD-10-CM | POA: Diagnosis not present

## 2023-12-03 DIAGNOSIS — E538 Deficiency of other specified B group vitamins: Secondary | ICD-10-CM

## 2023-12-03 NOTE — Progress Notes (Signed)
 PCP:  Liana Fish, NP   Chief Complaint  Patient presents with   Annual Exam     HPI:      [name] is a 52 y.o. [id number] who LMP was Patient's last menstrual period was 02/12/2023., presents today for her annual examination.  Her menses are absent with OCPs. Stopped them earlier this year to assess for menopause; menses were monthly and heavy so restarted 3/24 and no menses since. No BTB/dysmen. Does have VS sx, under increased stress.   Sex activity: single partner, contraception - OCPs. No pain/bleeding Last Pap: 10/19/20  Results were: no abnormalities /neg HPV DNA  Hx of STDs: none  Last mammogram: 10/31/22  Results were: normal--routine follow-up in 12 months There is no FH of breast cancer. There is no FH of ovarian cancer. The patient does do self-breast exams.  Tobacco use: The patient denies current or previous tobacco use. Alcohol use: none No drug use.  Exercise: moderately active  Colonoscopy: 11/10/22 with polyp at [facility]; repeat after 3 yrs.  Neg Cologuard 12/21  She does get adequate calcium and Vitamin D  in her diet. Labs ordered by PCP  She takes xanax  sparingly for anxiety/panic attacks. Can't identify trigger but having Rx is helpful as preventive too. Needs RF  Past Medical History:  Diagnosis Date   Anxiety    History of mammogram 02/01/2015   birad 1   History of Papanicolaou smear of cervix 01/06/2013   neg/neg   Insomnia    Mild depression    Panic attacks    Screening for colon cancer 11/2020   NEG Cologuard; repeat after 3 yrs    Past Surgical History:  Procedure Laterality Date   APPENDECTOMY  2013   AUGMENTATION MAMMAPLASTY Bilateral 2003   COLONOSCOPY WITH PROPOFOL  N/A 11/10/2022   Procedure: COLONOSCOPY WITH PROPOFOL ;  Surgeon: Therisa Bi, MD;  Location: [facility] ENDOSCOPY;  Service: Gastroenterology;  Laterality: N/A;   EYE SURGERY  1974   WISDOM TOOTH EXTRACTION  1997   all four    Family History  Problem  Relation Age of Onset   Heart attack Mother    Heart attack Father 82       passed away   Diabetes Father    Lung disease Father        TB   Diabetes Maternal Grandmother    Cancer Maternal Grandfather 47       lung   Diabetes Maternal Grandfather    Diabetes Paternal Grandmother    Diabetes Paternal Grandfather    Breast cancer Neg Hx     Social History   Socioeconomic History   Marital status: Married    Spouse name: Not on file   Number of children: Not on file   Years of education: Not on file   Highest education level: Not on file  Occupational History   Not on file  Tobacco Use   Smoking status: Never   Smokeless tobacco: Never  Vaping Use   Vaping status: Never Used  Substance and Sexual Activity   Alcohol use: Yes    Comment: socially   Drug use: No   Sexual activity: Yes    Partners: Male    Birth control/protection: Pill  Other Topics Concern   Not on file  Social History Narrative   Not on file   Social Drivers of Health   Financial Resource Strain: Not on file  Food Insecurity: Not on file  Transportation Needs: Not on file  Physical Activity: Not on file  Stress: Not on file  Social Connections: Not on file  Intimate Partner Violence: Not on file    Current Meds  Medication Sig   [DISCONTINUED] ALPRAZolam  (XANAX ) 0.25 MG tablet Take 1 tablet (0.25 mg total) by mouth 2 (two) times daily as needed for anxiety.   [DISCONTINUED] norethindrone -ethinyl estradiol (LOESTRIN) 1-20 MG-MCG tablet Take 1 tablet by mouth daily.     ROS:  Review of Systems  Constitutional:  Negative for fatigue, fever and unexpected weight change.  Respiratory:  Negative for cough, shortness of breath and wheezing.   Cardiovascular:  Negative for chest pain, palpitations and leg swelling.  Gastrointestinal:  Positive for constipation. Negative for blood in stool, diarrhea, nausea and vomiting.  Endocrine: Negative for cold intolerance, heat intolerance and polyuria.   Genitourinary:  Negative for dyspareunia, dysuria, flank pain, frequency, genital sores, hematuria, menstrual problem, pelvic pain, urgency, vaginal bleeding, vaginal discharge and vaginal pain.  Musculoskeletal:  Negative for back pain, joint swelling and myalgias.  Skin:  Negative for rash.  Neurological:  Negative for dizziness, syncope, light-headedness, numbness and headaches.  Hematological:  Negative for adenopathy.  Psychiatric/Behavioral:  Positive for agitation. Negative for confusion, sleep disturbance and suicidal ideas. The patient is not nervous/anxious.      Objective: BP 98/62   Ht 5' 2 (1.575 m)   Wt 125 lb (56.7 kg)   LMP 02/12/2023   BMI 22.86 kg/m    Physical Exam Constitutional:      Appearance: She is well-developed.  Genitourinary:     Vulva normal.     Right Labia: No rash, tenderness or lesions.    Left Labia: No tenderness, lesions or rash.    No vaginal discharge, erythema or tenderness.      Right Adnexa: not tender and no mass present.    Left Adnexa: not tender and no mass present.    No cervical motion tenderness, friability or polyp.     Uterus is not enlarged or tender.  Breasts:    Right: No mass, nipple discharge, skin change or tenderness.     Left: No mass, nipple discharge, skin change or tenderness.  Neck:     Thyroid: No thyromegaly.  Cardiovascular:     Rate and Rhythm: Normal rate and regular rhythm.     Heart sounds: Normal heart sounds. No murmur heard. Pulmonary:     Effort: Pulmonary effort is normal.     Breath sounds: Normal breath sounds.  Abdominal:     Palpations: Abdomen is soft.     Tenderness: There is no abdominal tenderness. There is no guarding or rebound.  Musculoskeletal:        General: Normal range of motion.     Cervical back: Normal range of motion.  Lymphadenopathy:     Cervical: No cervical adenopathy.  Neurological:     General: No focal deficit present.     Mental Status: She is alert and  oriented to person, place, and time.     Cranial Nerves: No cranial nerve deficit.  Skin:    General: Skin is warm and dry.  Psychiatric:        Mood and Affect: Mood normal.        Behavior: Behavior normal.        Thought Content: Thought content normal.        Judgment: Judgment normal.  Vitals reviewed.     Assessment/Plan:  Encounter for annual routine gynecological examination  Cervical cancer screening -  Plan: Cytology - PAP  Screening for HPV (human papillomavirus) - Plan: Cytology - PAP  Encounter for surveillance of contraceptive pills - Plan: norethindrone -ethinyl estradiol (LOESTRIN) 1-20 MG-MCG tablet; OCP RF eRxd.   Perimenopause--re-eval menses next yr. Had monthly periods off OCPs 1/24.   Encounter for screening mammogram for malignant neoplasm of breast - Plan: MM 3D SCREENING MAMMOGRAM BILATERAL BREAST; pt to schedule mammo  Anxiety - Plan: ALPRAZolam  (XANAX ) 0.25 MG tablet; Rx RF. Pt takes sparinly prn.    Meds ordered this encounter  Medications   ALPRAZolam  (XANAX ) 0.25 MG tablet    Sig: Take 1 tablet (0.25 mg total) by mouth 2 (two) times daily as needed for anxiety.    Dispense:  30 tablet    Refill:  0    Supervising Provider:   CHERRY, ANIKA [[id number]]   norethindrone -ethinyl estradiol (LOESTRIN) 1-20 MG-MCG tablet    Sig: Take 1 tablet by mouth daily.    Dispense:  84 tablet    Refill:  3    Supervising Provider:   CHERRY, ANIKA [[id number]]              GYN counsel mammography screening, use and side effects of OCP's, adequate intake of calcium and vitamin D , diet and exercise     F/U  Return in about 1 year (around 12/05/2024).  Altan Kraai B. Laterrian Hevener, PA-C 12/06/2023 12:00 PM

## 2023-12-03 NOTE — Progress Notes (Cosign Needed)
[facility] [address] [place], [place] [address]  Internal MEDICINE  Office Visit Note  Patient Name: Tammy Wilkins  [id number]  [id number]  Date of Service: 12/03/2023  Chief Complaint  Patient presents with   Depression   Annual Exam     Tammy Wilkins presents for an annual well visit and physical exam.  Well-appearing 52 y.o. female with no major medical conditions  Routine CRC screening: due for colonoscopy in 2026 Routine mammogram: will have this schedule through OBGYN Pap smear: sees obgyn  Labs: due for routine labs  New or worsening pain: none  Other concerns: none  Taking vitamins, numbness and tingling in feet resolved.     Current Medication: Outpatient Encounter Medications as of 12/03/2023  Medication Sig   ALPRAZolam (XANAX) 0.25 MG tablet Take 1 tablet (0.25 mg total) by mouth 2 (two) times daily as needed for anxiety.   norethindrone-ethinyl estradiol (LOESTRIN) 1-20 MG-MCG tablet Take 1 tablet by mouth daily.   No facility-administered encounter medications on file as of 12/03/2023.    Surgical History: Past Surgical History:  Procedure Laterality Date   APPENDECTOMY  2013   AUGMENTATION MAMMAPLASTY Bilateral 2003   COLONOSCOPY WITH PROPOFOL N/A 11/10/2022   Procedure: COLONOSCOPY WITH PROPOFOL;  Surgeon: Wyline Mood, MD;  Location: [facility] ENDOSCOPY;  Service: Gastroenterology;  Laterality: N/A;   EYE SURGERY  1974   WISDOM TOOTH EXTRACTION  1997   all four    Medical History: Past Medical History:  Diagnosis Date   Anxiety    History of mammogram 02/01/2015   birad 1   History of Papanicolaou smear of cervix 01/06/2013   neg/neg   Insomnia    Mild depression    Panic attacks    Screening for colon cancer 11/2020   NEG Cologuard; repeat after 3 yrs    Family History: Family History  Problem Relation Age of Onset   Heart attack Mother    Heart attack Father 67       passed away   Diabetes Father    Lung disease  Father        TB   Diabetes Maternal Grandmother    Cancer Maternal Grandfather 16       lung   Diabetes Maternal Grandfather    Diabetes Paternal Grandmother    Diabetes Paternal Grandfather    Breast cancer Neg Hx     Social History   Socioeconomic History   Marital status: Married    Spouse name: Not on file   Number of children: Not on file   Years of education: Not on file   Highest education level: Not on file  Occupational History   Not on file  Tobacco Use   Smoking status: Never   Smokeless tobacco: Never  Vaping Use   Vaping status: Never Used  Substance and Sexual Activity   Alcohol use: Yes    Comment: socially   Drug use: No   Sexual activity: Yes    Partners: Male    Birth control/protection: Pill  Other Topics Concern   Not on file  Social History Narrative   Not on file   Social Drivers of Health   Financial Resource Strain: Not on file  Food Insecurity: Not on file  Transportation Needs: Not on file  Physical Activity: Not on file  Stress: Not on file  Social Connections: Not on file  Intimate Partner Violence: Not on file      Review of Systems  Constitutional:  Negative for activity change, appetite change, chills, fatigue, fever and unexpected weight change.  HENT: Negative.  Negative for congestion, ear pain, rhinorrhea, sore throat and trouble swallowing.   Eyes: Negative.   Respiratory: Negative.  Negative for cough, chest tightness, shortness of breath and wheezing.   Cardiovascular: Negative.  Negative for chest pain and palpitations.  Gastrointestinal: Negative.  Negative for abdominal pain, blood in stool, constipation, diarrhea, nausea and vomiting.  Endocrine: Negative.   Genitourinary: Negative.  Negative for difficulty urinating, dysuria, frequency, hematuria and urgency.  Musculoskeletal: Negative.  Negative for arthralgias, back pain, joint swelling, myalgias and neck pain.  Skin: Negative.  Negative for rash and wound.   Allergic/Immunologic: Negative.  Negative for immunocompromised state.  Neurological:  Negative for dizziness, seizures, weakness, numbness (toes and tingling in toes -- resolved) and headaches.  Hematological: Negative.   Psychiatric/Behavioral:  Positive for depression. Negative for self-injury and suicidal ideas. The patient is not nervous/anxious.        Negative for depression    Vital Signs: BP 110/66   Pulse 79   Temp 98.4 F (36.9 C)   Resp 16   Ht 5\' 2"  (1.575 m)   Wt 125 lb 6.4 oz (56.9 kg)   SpO2 99%   BMI 22.94 kg/m    Physical Exam Vitals reviewed.  Constitutional:      General: Tammy Wilkins is awake. Tammy Wilkins is not in acute distress.    Appearance: Normal appearance. Tammy Wilkins is well-developed, well-groomed and normal weight. Tammy Wilkins is not ill-appearing or diaphoretic.  HENT:     Head: Normocephalic and atraumatic.     Right Ear: Hearing, tympanic membrane, ear canal and external ear normal.     Left Ear: Hearing, tympanic membrane, ear canal and external ear normal.     Nose: Nose normal. No congestion or rhinorrhea.     Mouth/Throat:     Mouth: Mucous membranes are moist.     Pharynx: Oropharynx is clear. No oropharyngeal exudate or posterior oropharyngeal erythema.  Eyes:     General: Lids are normal. Vision grossly intact. Gaze aligned appropriately. No scleral icterus.       Right eye: No discharge.        Left eye: No discharge.     Extraocular Movements: Extraocular movements intact.     Conjunctiva/sclera: Conjunctivae normal.     Pupils: Pupils are equal, round, and reactive to light.  Neck:     Thyroid: No thyromegaly.     Vascular: No JVD.     Trachea: Trachea and phonation normal. No tracheal deviation.  Cardiovascular:     Rate and Rhythm: Normal rate and regular rhythm.     Pulses: Normal pulses.     Heart sounds: Normal heart sounds, S1 normal and S2 normal. No murmur heard.    No friction rub. No gallop.  Pulmonary:     Effort: Pulmonary effort is normal.  No respiratory distress.     Breath sounds: Normal breath sounds. No stridor. No wheezing or rales.  Chest:     Chest wall: No tenderness.     Comments: Decline clinical breast exam, sees OBGYN and had mammogram recently Abdominal:     General: Bowel sounds are normal. There is no distension.     Palpations: Abdomen is soft. There is no shifting dullness, fluid wave, mass or pulsatile mass.     Tenderness: There is no abdominal tenderness. There is no guarding or rebound.  Musculoskeletal:        General:  No tenderness or deformity. Normal range of motion.     Cervical back: Normal range of motion and neck supple.     Right lower leg: No edema.     Left lower leg: No edema.  Lymphadenopathy:     Cervical: No cervical adenopathy.  Skin:    General: Skin is warm and dry.     Capillary Refill: Capillary refill takes less than 2 seconds.     Coloration: Skin is not pale.     Findings: No erythema or rash.  Neurological:     Mental Status: Tammy Wilkins is alert and oriented to person, place, and time.     Cranial Nerves: No cranial nerve deficit.     Motor: No abnormal muscle tone.     Coordination: Coordination normal.     Gait: Gait normal.     Deep Tendon Reflexes: Reflexes are normal and symmetric.  Psychiatric:        Mood and Affect: Mood and affect normal.        Behavior: Behavior normal. Behavior is cooperative.        Thought Content: Thought content normal.        Judgment: Judgment normal.        Assessment/Plan: 1. Encounter for routine adult health examination with abnormal findings (Primary) Age-appropriate preventive screenings and vaccinations discussed, annual physical exam completed. Routine labs for health maintenance will be ordered. PHM updated.    2. Mixed hyperlipidemia Repeat cholesterol panel  3. Vitamin D deficiency Continue OTC vitamin D supplement  4. B12 deficiency Taking OTC B12 supplement      General Counseling: Tammy Wilkins  understanding of the findings of todays visit and agrees with plan of treatment. I have discussed any further diagnostic evaluation that may be needed or ordered today. We also reviewed her medications today. Tammy Wilkins has been encouraged to call the office with any questions or concerns that should arise related to todays visit.    No orders of the defined types were placed in this encounter.   No orders of the defined types were placed in this encounter.   Return in about 1 year (around 12/02/2024) for CPE, Tammy Wilkins PCP and otherwise as needed. .   Total time spent:30 Minutes Time spent includes review of chart, medications, test results, and follow up plan with the patient.   [place] Controlled Substance Database was reviewed by me.  This patient was seen by Sallyanne Kuster, FNP-C in collaboration with Dr. Beverely Risen as a part of collaborative care agreement.  Serine Kea R. Tedd Sias, MSN, FNP-C Internal medicine

## 2023-12-06 ENCOUNTER — Encounter: Payer: Self-pay | Admitting: Obstetrics and Gynecology

## 2023-12-06 ENCOUNTER — Other Ambulatory Visit (HOSPITAL_COMMUNITY)
Admission: RE | Admit: 2023-12-06 | Discharge: 2023-12-06 | Disposition: A | Discharge status: Home / Self Care | Payer: BC Managed Care – PPO | Source: Ambulatory Visit | Attending: Obstetrics and Gynecology | Admitting: Obstetrics and Gynecology

## 2023-12-06 ENCOUNTER — Ambulatory Visit: Payer: BC Managed Care – PPO | Admitting: Obstetrics and Gynecology

## 2023-12-06 VITALS — BP 98/62 | Ht 62.0 in | Wt 125.0 lb

## 2023-12-06 DIAGNOSIS — Z01419 Encounter for gynecological examination (general) (routine) without abnormal findings: Secondary | ICD-10-CM

## 2023-12-06 DIAGNOSIS — Z1151 Encounter for screening for human papillomavirus (HPV): Secondary | ICD-10-CM | POA: Insufficient documentation

## 2023-12-06 DIAGNOSIS — Z1231 Encounter for screening mammogram for malignant neoplasm of breast: Secondary | ICD-10-CM

## 2023-12-06 DIAGNOSIS — Z124 Encounter for screening for malignant neoplasm of cervix: Secondary | ICD-10-CM | POA: Diagnosis present

## 2023-12-06 DIAGNOSIS — F419 Anxiety disorder, unspecified: Secondary | ICD-10-CM

## 2023-12-06 DIAGNOSIS — N951 Menopausal and female climacteric states: Secondary | ICD-10-CM

## 2023-12-06 DIAGNOSIS — Z3041 Encounter for surveillance of contraceptive pills: Secondary | ICD-10-CM

## 2023-12-06 MED ORDER — ALPRAZOLAM 0.25 MG PO TABS: 0.2500 mg | ORAL_TABLET | Freq: Two times a day (BID) | ORAL | 0 refills | Status: AC | PRN

## 2023-12-06 MED ORDER — NORETHINDRONE ACET-ETHINYL EST 1-20 MG-MCG PO TABS: 1.0000 | ORAL_TABLET | Freq: Every day | ORAL | 3 refills | Status: DC

## 2023-12-06 NOTE — Patient Instructions (Signed)
 I value your feedback and you entrusting [place] with your care. If you get a [place] patient survey, I would appreciate you taking the time to let us know about your experience today. Thank you! ? ? ?

## 2023-12-11 LAB — CYTOLOGY - PAP
Comment: NEGATIVE
Diagnosis: NEGATIVE
Diagnosis: REACTIVE
High risk HPV: NEGATIVE

## 2023-12-13 ENCOUNTER — Other Ambulatory Visit: Payer: Self-pay | Admitting: Nurse Practitioner

## 2023-12-14 LAB — CBC WITH DIFFERENTIAL/PLATELET
Basophils Absolute: 0 10*3/uL (ref 0.0–0.2)
Basos: 0 %
EOS (ABSOLUTE): 0 10*3/uL (ref 0.0–0.4)
Eos: 0 %
Hematocrit: 41.3 % (ref 34.0–46.6)
Hemoglobin: 13.6 g/dL (ref 11.1–15.9)
Immature Grans (Abs): 0.1 10*3/uL (ref 0.0–0.1)
Immature Granulocytes: 1 %
Lymphocytes Absolute: 1.8 10*3/uL (ref 0.7–3.1)
Lymphs: 19 %
MCH: 30 pg (ref 26.6–33.0)
MCHC: 32.9 g/dL (ref 31.5–35.7)
MCV: 91 fL (ref 79–97)
Monocytes Absolute: 0.6 10*3/uL (ref 0.1–0.9)
Monocytes: 7 %
Neutrophils Absolute: 6.7 10*3/uL (ref 1.4–7.0)
Neutrophils: 73 %
Platelets: 386 10*3/uL (ref 150–450)
RBC: 4.53 x10E6/uL (ref 3.77–5.28)
RDW: 12.9 % (ref 11.7–15.4)
WBC: 9.1 10*3/uL (ref 3.4–10.8)

## 2023-12-14 LAB — LIPID PANEL
Chol/HDL Ratio: 3.3 {ratio} (ref 0.0–4.4)
Cholesterol, Total: 192 mg/dL (ref 100–199)
HDL: 58 mg/dL (ref >39)
LDL Chol Calc (NIH): 115 mg/dL — ABNORMAL HIGH (ref 0–99)
Triglycerides: 104 mg/dL (ref 0–149)
VLDL Cholesterol Cal: 19 mg/dL (ref 5–40)

## 2023-12-14 LAB — COMPREHENSIVE METABOLIC PANEL
ALT: 13 [IU]/L (ref 0–32)
AST: 12 [IU]/L (ref 0–40)
Albumin: 4.2 g/dL (ref 3.8–4.9)
Alkaline Phosphatase: 38 [IU]/L — ABNORMAL LOW (ref 44–121)
BUN/Creatinine Ratio: 18 (ref 9–23)
BUN: 14 mg/dL (ref 6–24)
Bilirubin Total: 0.2 mg/dL (ref 0.0–1.2)
CO2: 23 mmol/L (ref 20–29)
Calcium: 9.3 mg/dL (ref 8.7–10.2)
Chloride: 104 mmol/L (ref 96–106)
Creatinine, Ser: 0.77 mg/dL (ref 0.57–1.00)
Globulin, Total: 2.4 g/dL (ref 1.5–4.5)
Glucose: 85 mg/dL (ref 70–99)
Potassium: 4.3 mmol/L (ref 3.5–5.2)
Sodium: 138 mmol/L (ref 134–144)
Total Protein: 6.6 g/dL (ref 6.0–8.5)
eGFR: 93 mL/min/{1.73_m2} (ref >59)

## 2023-12-14 LAB — VITAMIN D 25 HYDROXY (VIT D DEFICIENCY, FRACTURES): Vit D, 25-Hydroxy: 49.7 ng/mL (ref 30.0–100.0)

## 2023-12-14 LAB — B12 AND FOLATE PANEL
Folate: 14.1 ng/mL (ref >3.0)
Vitamin B-12: 766 pg/mL (ref 232–1245)

## 2023-12-14 LAB — IRON AND TIBC
Iron Saturation: 38 % (ref 15–55)
Iron: 112 ug/dL (ref 27–159)
Total Iron Binding Capacity: 294 ug/dL (ref 250–450)
UIBC: 182 ug/dL (ref 131–425)

## 2023-12-14 LAB — FERRITIN: Ferritin: 124 ng/mL (ref 15–150)

## 2023-12-19 ENCOUNTER — Ambulatory Visit
Admission: RE | Admit: 2023-12-19 | Discharge: 2023-12-19 | Disposition: A | Discharge status: Home / Self Care | Payer: BC Managed Care – PPO | Source: Ambulatory Visit | Attending: Obstetrics and Gynecology | Admitting: Obstetrics and Gynecology

## 2023-12-19 DIAGNOSIS — Z1231 Encounter for screening mammogram for malignant neoplasm of breast: Secondary | ICD-10-CM | POA: Insufficient documentation

## 2023-12-21 ENCOUNTER — Encounter: Payer: Self-pay | Admitting: Obstetrics and Gynecology

## 2024-02-04 ENCOUNTER — Other Ambulatory Visit: Payer: Self-pay | Admitting: Obstetrics and Gynecology

## 2024-02-04 DIAGNOSIS — Z3041 Encounter for surveillance of contraceptive pills: Secondary | ICD-10-CM

## 2024-12-03 ENCOUNTER — Ambulatory Visit: Payer: BC Managed Care – PPO | Admitting: Nurse Practitioner

## 2024-12-03 ENCOUNTER — Encounter: Payer: Self-pay | Admitting: Nurse Practitioner

## 2024-12-03 VITALS — BP 100/72 | HR 66 | Temp 96.9°F | Resp 16 | Ht 62.0 in | Wt 131.8 lb

## 2024-12-03 DIAGNOSIS — E782 Mixed hyperlipidemia: Secondary | ICD-10-CM

## 2024-12-03 DIAGNOSIS — Z0001 Encounter for general adult medical examination with abnormal findings: Secondary | ICD-10-CM | POA: Diagnosis not present

## 2024-12-03 DIAGNOSIS — Z23 Encounter for immunization: Secondary | ICD-10-CM

## 2024-12-03 DIAGNOSIS — R300 Dysuria: Secondary | ICD-10-CM | POA: Diagnosis not present

## 2024-12-03 MED ORDER — ZOSTER VAC RECOMB ADJUVANTED 50 MCG/0.5ML IM SUSR: 0.5000 mL | Freq: Once | INTRAMUSCULAR | 1 refills | Status: AC | PRN

## 2024-12-03 NOTE — Progress Notes (Signed)
 [facility] [address] [place], [place] [address]  Internal MEDICINE  Office Visit Note  Patient Name: Tammy Wilkins  [id number]  [id number]  Date of Service: 12/03/2024  Chief Complaint  Patient presents with   Depression   Annual Exam     Tammy Wilkins presents for an annual well visit and physical exam.  Well-appearing 53 y.o. female with no major medical conditions  Routine CRC screening: colonoscopy due in December 2026 Routine mammogram: due in January 2026 DEXA scan: not due yet  Pap smear: due in January 2030 Eye exam and/or foot exam: Labs: done in January. Gets labs every other year New or worsening pain: none  Other concerns: none  Tingling in her feet has resolved. Has been feeling better in general this year  Has started doing pilates this year as well.  Due for shingles vaccine Wants to postpone pneumonia vaccination   Current Medication: Outpatient Encounter Medications as of 12/03/2024  Medication Sig   Zoster Vaccine Adjuvanted [facility]) injection Inject 0.5 mLs into the muscle once as needed for up to 1 dose.   ALPRAZolam  (XANAX ) 0.25 MG tablet Take 1 tablet (0.25 mg total) by mouth 2 (two) times daily as needed for anxiety.   norethindrone -ethinyl estradiol (LOESTRIN) 1-20 MG-MCG tablet Take 1 tablet by mouth daily.   No facility-administered encounter medications on file as of 12/03/2024.    Surgical History: Past Surgical History:  Procedure Laterality Date   APPENDECTOMY  2013   AUGMENTATION MAMMAPLASTY Bilateral 2003   COLONOSCOPY WITH PROPOFOL  N/A 11/10/2022   Procedure: COLONOSCOPY WITH PROPOFOL ;  Surgeon: Therisa Bi, MD;  Location: [facility] ENDOSCOPY;  Service: Gastroenterology;  Laterality: N/A;   EYE SURGERY  1974   WISDOM TOOTH EXTRACTION  1997   all four    Medical History: Past Medical History:  Diagnosis Date   Anxiety    History of mammogram 02/01/2015   birad 1   History of Papanicolaou smear of cervix  01/06/2013   neg/neg   Insomnia    Mild depression    Panic attacks    Screening for colon cancer 11/2020   NEG Cologuard; repeat after 3 yrs    Family History: Family History  Problem Relation Age of Onset   Heart attack Mother    Heart attack Father 66       passed away   Diabetes Father    Lung disease Father        TB   Diabetes Maternal Grandmother    Cancer Maternal Grandfather 69       lung   Diabetes Maternal Grandfather    Diabetes Paternal Grandmother    Diabetes Paternal Grandfather    Breast cancer Neg Hx     Social History   Socioeconomic History   Marital status: Married    Spouse name: Not on file   Number of children: Not on file   Years of education: Not on file   Highest education level: Not on file  Occupational History   Not on file  Tobacco Use   Smoking status: Never   Smokeless tobacco: Never  Vaping Use   Vaping status: Never Used  Substance and Sexual Activity   Alcohol use: Yes    Comment: socially   Drug use: No   Sexual activity: Yes    Partners: Male    Birth control/protection: Pill  Other Topics Concern   Not on file  Social History Narrative   Not on file   Social Drivers  of Health   Tobacco Use: Low Risk (12/03/2024)   Patient History    Smoking Tobacco Use: Never    Smokeless Tobacco Use: Never    Passive Exposure: Not on file  Financial Resource Strain: Not on file  Food Insecurity: Not on file  Transportation Needs: Not on file  Physical Activity: Not on file  Stress: Not on file  Social Connections: Not on file  Intimate Partner Violence: Not on file  Depression (PHQ2-9): Low Risk (11/28/2022)   Depression (PHQ2-9)    PHQ-2 Score: 0  Alcohol Screen: Low Risk (11/28/2022)   Alcohol Screen    Last Alcohol Screening Score (AUDIT): 2  Housing: Unknown (03/27/2024)   Received from [facility]   Epic    Unable to Pay for Housing in the Last Year: Not on file    Number of Times Moved in the  Last Year: Not on file    At any time in the past 12 months, were you homeless or living in a shelter (including now)?: No  Utilities: Not on file  Health Literacy: Not on file      Review of Systems  Constitutional:  Negative for activity change, appetite change, chills, fatigue, fever and unexpected weight change.  HENT: Negative.  Negative for congestion, ear pain, rhinorrhea, sore throat and trouble swallowing.   Eyes: Negative.   Respiratory: Negative.  Negative for cough, chest tightness, shortness of breath and wheezing.   Cardiovascular: Negative.  Negative for chest pain and palpitations.  Gastrointestinal: Negative.  Negative for abdominal pain, blood in stool, constipation, diarrhea, nausea and vomiting.  Endocrine: Negative.   Genitourinary: Negative.  Negative for difficulty urinating, dysuria, frequency, hematuria and urgency.  Musculoskeletal: Negative.  Negative for arthralgias, back pain, joint swelling, myalgias and neck pain.  Skin: Negative.  Negative for rash and wound.  Allergic/Immunologic: Negative.  Negative for immunocompromised state.  Neurological:  Negative for dizziness, seizures, weakness, numbness (toes and tingling in toes -- resolved) and headaches.  Hematological: Negative.   Psychiatric/Behavioral:  Positive for depression. Negative for self-injury and suicidal ideas. The patient is not nervous/anxious.        Negative for depression    Vital Signs: BP 100/72   Pulse 66   Temp (!) 96.9 F (36.1 C)   Resp 16   Ht 5' 2 (1.575 m)   Wt 131 lb 12.8 oz (59.8 kg)   SpO2 98%   BMI 24.11 kg/m    Physical Exam Vitals reviewed.  Constitutional:      General: She is awake. She is not in acute distress.    Appearance: Normal appearance. She is well-developed, well-groomed and normal weight. She is not ill-appearing or diaphoretic.  HENT:     Head: Normocephalic and atraumatic.     Right Ear: Hearing, tympanic membrane, ear canal and external ear  normal.     Left Ear: Hearing, tympanic membrane, ear canal and external ear normal.     Nose: Nose normal. No congestion or rhinorrhea.     Mouth/Throat:     Mouth: Mucous membranes are moist.     Pharynx: Oropharynx is clear. No oropharyngeal exudate or posterior oropharyngeal erythema.  Eyes:     General: Lids are normal. Vision grossly intact. Gaze aligned appropriately. No scleral icterus.       Right eye: No discharge.        Left eye: No discharge.     Extraocular Movements: Extraocular movements intact.     Conjunctiva/sclera:  Conjunctivae normal.     Pupils: Pupils are equal, round, and reactive to light.  Neck:     Thyroid: No thyromegaly.     Vascular: No JVD.     Trachea: Trachea and phonation normal. No tracheal deviation.  Cardiovascular:     Rate and Rhythm: Normal rate and regular rhythm.     Pulses: Normal pulses.     Heart sounds: Normal heart sounds, S1 normal and S2 normal. No murmur heard.    No friction rub. No gallop.  Pulmonary:     Effort: Pulmonary effort is normal. No respiratory distress.     Breath sounds: Normal breath sounds. No stridor. No wheezing or rales.  Chest:     Chest wall: No tenderness.     Comments: Decline clinical breast exam, sees OBGYN and had mammogram recently Abdominal:     General: Bowel sounds are normal. There is no distension.     Palpations: Abdomen is soft. There is no shifting dullness, fluid wave, mass or pulsatile mass.     Tenderness: There is no abdominal tenderness. There is no guarding or rebound.  Musculoskeletal:        General: No tenderness or deformity. Normal range of motion.     Cervical back: Normal range of motion and neck supple.     Right lower leg: No edema.     Left lower leg: No edema.  Lymphadenopathy:     Cervical: No cervical adenopathy.  Skin:    General: Skin is warm and dry.     Capillary Refill: Capillary refill takes less than 2 seconds.     Coloration: Skin is not pale.     Findings: No  erythema or rash.  Neurological:     Mental Status: She is alert and oriented to person, place, and time.     Cranial Nerves: No cranial nerve deficit.     Motor: No abnormal muscle tone.     Coordination: Coordination normal.     Gait: Gait normal.     Deep Tendon Reflexes: Reflexes are normal and symmetric.  Psychiatric:        Mood and Affect: Mood and affect normal.        Behavior: Behavior normal. Behavior is cooperative.        Thought Content: Thought content normal.        Judgment: Judgment normal.        Assessment/Plan: 1. Encounter for routine adult health examination with abnormal findings (Primary) Age-appropriate preventive screenings and vaccinations discussed, annual physical exam completed. Routine labs for health maintenance deferred until next year. PHM updated.    2. Mixed hyperlipidemia Mild, continue low cholesterol and low fat diet and continue physical activity as tolerated.  3. Dysuria Urine sent to the lab - UA/M w/rflx Culture, Routine - Microscopic Examination - Urine Culture, Reflex  4. Need for vaccination - Zoster Vaccine Adjuvanted [facility]) injection; Inject 0.5 mLs into the muscle once as needed for up to 1 dose.  Dispense: 0.5 mL; Refill: 1      General Counseling: Caraline verbalizes understanding of the findings of todays visit and agrees with plan of treatment. I have discussed any further diagnostic evaluation that may be needed or ordered today. We also reviewed her medications today. she has been encouraged to call the office with any questions or concerns that should arise related to todays visit.    Orders Placed This Encounter  Procedures   Microscopic Examination   Urine Culture, Reflex  UA/M w/rflx Culture, Routine    Meds ordered this encounter  Medications   Zoster Vaccine Adjuvanted [facility]) injection    Sig: Inject 0.5 mLs into the muscle once as needed for up to 1 dose.    Dispense:  0.5 mL    Refill:  1     Due for 2 dose series    Return in about 1 year (around 12/03/2025) for CPE, Raaga Maeder PCP and otherwise as needed .   Total time spent:30 Minutes Time spent includes review of chart, medications, test results, and follow up plan with the patient.   [place] Controlled Substance Database was reviewed by me.  This patient was seen by Mardy Maxin, FNP-C in collaboration with Dr. Sigrid Bathe as a part of collaborative care agreement.  Tiani Stanbery R. Maxin, MSN, FNP-C Internal medicine

## 2024-12-04 ENCOUNTER — Encounter: Payer: Self-pay | Admitting: Nurse Practitioner

## 2024-12-05 LAB — UA/M W/RFLX CULTURE, ROUTINE
Bilirubin, UA: NEGATIVE
Glucose, UA: NEGATIVE
Ketones, UA: NEGATIVE
Nitrite, UA: NEGATIVE
Protein,UA: NEGATIVE
RBC, UA: NEGATIVE
Specific Gravity, UA: 1.016 (ref 1.005–1.030)
Urobilinogen, Ur: 0.2 mg/dL (ref 0.2–1.0)
pH, UA: 5.5 (ref 5.0–7.5)

## 2024-12-05 LAB — MICROSCOPIC EXAMINATION: Casts: NONE SEEN /LPF

## 2024-12-05 LAB — URINE CULTURE, REFLEX: Organism ID, Bacteria: NO GROWTH

## 2024-12-08 DIAGNOSIS — Z3041 Encounter for surveillance of contraceptive pills: Secondary | ICD-10-CM

## 2024-12-08 MED ORDER — NORETHINDRONE ACET-ETHINYL EST 1-20 MG-MCG PO TABS: 1.0000 | ORAL_TABLET | Freq: Every day | ORAL | 0 refills | Status: DC

## 2024-12-08 NOTE — Addendum Note (Signed)
 Addended by: TAFT CAMELIA MATSU on: 12/08/2024 02:00 PM   Modules accepted: Orders

## 2024-12-08 NOTE — Telephone Encounter (Signed)
 TRIAGE VOICEMAIL: Patient states she has an appointment on 12/29/24 for annual. Requesting refill of norethindrone -ethinyl estradiol (LOESTRIN) 1-20 MG-MCG tablet .

## 2024-12-08 NOTE — Telephone Encounter (Signed)
 Left voicemail to advise patient refill sent.

## 2024-12-26 ENCOUNTER — Other Ambulatory Visit: Payer: Self-pay

## 2024-12-26 DIAGNOSIS — Z3041 Encounter for surveillance of contraceptive pills: Secondary | ICD-10-CM

## 2024-12-26 MED ORDER — NORETHINDRONE ACET-ETHINYL EST 1-20 MG-MCG PO TABS: 1.0000 | ORAL_TABLET | Freq: Every day | ORAL | 0 refills | Status: AC

## 2024-12-26 NOTE — Progress Notes (Signed)
 Rx RF due to annual appointment rescheduled due to anticipated bad weather over coming weekend and office closed Monday AM.

## 2024-12-29 ENCOUNTER — Ambulatory Visit: Admitting: Certified Nurse Midwife

## 2025-02-09 ENCOUNTER — Ambulatory Visit: Admitting: Certified Nurse Midwife

## 2025-09-30 ENCOUNTER — Ambulatory Visit: Payer: BC Managed Care – PPO | Admitting: Dermatology

## 2025-11-18 ENCOUNTER — Encounter: Admitting: Nurse Practitioner

## 2025-12-07 ENCOUNTER — Encounter: Admitting: Nurse Practitioner
